# Patient Record
Sex: Male | Born: 1945 | Race: Black or African American | Hispanic: No | Marital: Married | Smoking: Former smoker
Health system: Southern US, Community
[De-identification: ages and names within clinical notes are randomized; demographics above are authoritative.]

## PROBLEM LIST (undated history)

## (undated) DIAGNOSIS — E039 Hypothyroidism, unspecified: Secondary | ICD-10-CM

## (undated) DIAGNOSIS — C61 Malignant neoplasm of prostate: Secondary | ICD-10-CM

## (undated) HISTORY — PX: PROSTATE BIOPSY: SHX241

---

## 2001-09-09 ENCOUNTER — Encounter: Payer: Self-pay | Admitting: Emergency Medicine

## 2001-09-09 ENCOUNTER — Emergency Department (HOSPITAL_COMMUNITY): Admission: EM | Admit: 2001-09-09 | Discharge: 2001-09-09 | Payer: Self-pay | Admitting: Emergency Medicine

## 2003-01-28 ENCOUNTER — Encounter: Payer: Self-pay | Admitting: *Deleted

## 2003-01-28 ENCOUNTER — Ambulatory Visit (HOSPITAL_COMMUNITY): Admission: RE | Admit: 2003-01-28 | Discharge: 2003-01-28 | Payer: Self-pay | Admitting: *Deleted

## 2018-10-10 NOTE — Progress Notes (Signed)
GU Location of Tumor / Histology: prostatic adenocarcinoma  If Prostate Cancer, Gleason Score is (4 + 3) and PSA is (6.11) in March 2020. Prostate volume: 43 grams.   Jhordan Mckibben had a PSA drawn in January 2020 that was elevated at 4.4. His PSA was repeated in the same month proving to be 4.5.   Biopsies of prostate (if applicable) revealed:    Past/Anticipated interventions by urology, if any: prostate biopsy, referral to Dr. Tammi Klippel for consideration of IMRT vs brachytherapy  Past/Anticipated interventions by medical oncology, if any: no  Weight changes, if any: Denies. Reports he switched to a plant based diet and lost 18 pounds in four months.  Bowel/Bladder complaints, if any: IPSS 2.  SHIM 25. Reports rare scant post void dribble. Denies dysuria or hematuria. Denies any bowel complaints.   Nausea/Vomiting, if any: no  Pain issues, if any:  no  SAFETY ISSUES:  Prior radiation? Yes for over active thyroid at age 22 at [facility]  Pacemaker/ICD? no  Possible current pregnancy? no, male patient  Is the patient on methotrexate? no  Current Complaints / other details:  73 year old male. Married.

## 2018-10-12 ENCOUNTER — Ambulatory Visit
Admission: RE | Admit: 2018-10-12 | Discharge: 2018-10-12 | Disposition: A | Discharge status: Home / Self Care | Payer: Medicare Other | Source: Ambulatory Visit | Attending: Radiation Oncology | Admitting: Radiation Oncology

## 2018-10-12 ENCOUNTER — Encounter: Payer: Self-pay | Admitting: Radiation Oncology

## 2018-10-12 ENCOUNTER — Other Ambulatory Visit: Payer: Self-pay

## 2018-10-12 VITALS — Ht 76.0 in | Wt 238.0 lb

## 2018-10-12 DIAGNOSIS — C61 Malignant neoplasm of prostate: Secondary | ICD-10-CM

## 2018-10-12 HISTORY — DX: Malignant neoplasm of prostate: C61

## 2018-10-12 NOTE — Progress Notes (Signed)
See progress note under physician encounter. 

## 2018-10-12 NOTE — Progress Notes (Addendum)
Radiation Oncology         [phone] ________________________________  Initial WebEx Consultation  Name: Nicholas Levine MRN: [id number]  Date: 10/12/2018  DOB: [birth year]  CC:No primary care provider on file.  McKenzie, Candee Furbish, MD   REFERRING PHYSICIAN: Cleon Gustin, MD  DIAGNOSIS: 73 y.o. gentleman with Stage T1c adenocarcinoma of the prostate with Gleason score of 4+3 and PSA of 6.11.    ICD-10-CM   1. Malignant neoplasm of prostate ([place]) C61     HISTORY OF PRESENT ILLNESS: Nicholas Levine is a 73 y.o. male with a diagnosis of prostate cancer. He was noted to have an elevated PSA of 4.5 by his primary care physician, Dr. Azalia Bilis.  Accordingly, he was referred for evaluation in urology by Dr. Alyson Ingles on 06/19/2018,  digital rectal examination was performed at that time revealing no nodules.  Repeat PSA on 07/30/2018 was 6.11.  The patient proceeded to transrectal ultrasound with 12 biopsies of the prostate on 08/28/2018.  The prostate volume measured 43 cc.  Out of 12 core biopsies, 6 were positive, all on the left.  The maximum Gleason score was 4+3, and this was seen in left mid lateral with perineural invasion. Gleason 3+4 was seen in the other 5 left cores with perineural invasion noted in left base lateral.  The patient reviewed the biopsy results with his urologist and he has kindly been referred today for discussion of potential radiation treatment options.   PREVIOUS RADIATION THERAPY: Yes  1965: Thyroid (at [place], age 52)  PAST MEDICAL HISTORY:  Past Medical History:  Diagnosis Date  . Prostate cancer ([address])       PAST SURGICAL HISTORY: Past Surgical History:  Procedure Laterality Date  . PROSTATE BIOPSY      FAMILY HISTORY:  Family History  Problem Relation Age of Onset  . Leukemia Maternal Aunt   . Leukemia Paternal Aunt     SOCIAL HISTORY:  Social History   Socioeconomic History  . Marital status: Married    Spouse name: Not on file  .  Number of children: 5  . Years of education: Not on file  . Highest education level: Not on file  Occupational History    Comment: retired  Scientific laboratory technician  . Financial resource strain: Not on file  . Food insecurity:    Worry: Not on file    Inability: Not on file  . Transportation needs:    Medical: Not on file    Non-medical: Not on file  Tobacco Use  . Smoking status: Former Smoker    Packs/day: 0.25    Years: 13.00    Pack years: 3.25    Types: Cigarettes    Last attempt to quit: 05/31/1971    Years since quitting: 47.4  . Smokeless tobacco: Never Used  . Tobacco comment: from age 73 to age 37  Substance and Sexual Activity  . Alcohol use: Not Currently    Frequency: Never    Comment: hasn't drank alcohol since age 86  . Drug use: Never  . Sexual activity: Yes  Lifestyle  . Physical activity:    Days per week: Not on file    Minutes per session: Not on file  . Stress: Not on file  Relationships  . Social connections:    Talks on phone: Not on file    Gets together: Not on file    Attends religious service: Not on file    Active member of club or organization: Not on  file    Attends meetings of clubs or organizations: Not on file    Relationship status: Not on file  . Intimate partner violence:    Fear of current or ex partner: Not on file    Emotionally abused: Not on file    Physically abused: Not on file    Forced sexual activity: Not on file  Other Topics Concern  . Not on file  Social History Narrative  . Not on file    ALLERGIES: Sulfa antibiotics  MEDICATIONS:  Current Outpatient Medications  Medication Sig Dispense Refill  . levothyroxine (SYNTHROID) 50 MCG tablet Take 150 mcg by mouth daily.    . Multiple Vitamins-Minerals (MULTIVITAMIN ADULT PO) Take by mouth.    . NON FORMULARY OTC prostate supplement     No current facility-administered medications for this encounter.     REVIEW OF SYSTEMS:  On review of systems, the patient reports that he  is doing well overall. He denies any chest pain, shortness of breath, cough, fevers, chills, night sweats, unintended weight changes. He denies any bowel disturbances, and denies abdominal pain, nausea or vomiting. He denies any new musculoskeletal or joint aches or pains. His IPSS was 2, indicating mild urinary symptoms. Reports rare scant post-void dribble. His SHIM was 25, indicating he does not erectile dysfunction. A complete review of systems is obtained and is otherwise negative.    PHYSICAL EXAM:  Wt Readings from Last 3 Encounters:  10/12/18 238 lb (108 kg)   Temp Readings from Last 3 Encounters:  No data found for Temp   BP Readings from Last 3 Encounters:  No data found for BP   Pulse Readings from Last 3 Encounters:  No data found for Pulse   Pain Assessment Pain Score: 0-No pain/10  In general this is a well appearing African American gentleman in no acute distress. He's alert and oriented x4 and appropriate throughout the examination. Cardiopulmonary assessment is negative for acute distress and he exhibits normal effort.    KPS = 100  100 - Normal; no complaints; no evidence of disease. 90   - Able to carry on normal activity; minor signs or symptoms of disease. 80   - Normal activity with effort; some signs or symptoms of disease. 55   - Cares for self; unable to carry on normal activity or to do active work. 60   - Requires occasional assistance, but is able to care for most of his personal needs. 50   - Requires considerable assistance and frequent medical care. 52   - Disabled; requires special care and assistance. 81   - Severely disabled; hospital admission is indicated although death not imminent. 53   - Very sick; hospital admission necessary; active supportive treatment necessary. 10   - Moribund; fatal processes progressing rapidly. 0     - Dead  Karnofsky DA, Abelmann WH, Craver LS and Burchenal JH [phone]) The use of the nitrogen mustards in the palliative  treatment of carcinoma: with particular reference to bronchogenic carcinoma Cancer 1 634-56  LABORATORY DATA:  No results found for: WBC, HGB, HCT, MCV, PLT No results found for: NA, K, CL, CO2 No results found for: ALT, AST, GGT, ALKPHOS, BILITOT   RADIOGRAPHY: No results found.    IMPRESSION/PLAN: 1. 73 y.o. gentleman with Stage T1c adenocarcinoma of the prostate with Gleason Score of 4+3, and PSA of 6.11. We discussed the patient's workup and outlines the nature of prostate cancer in this setting. The patient's Gleason's score  puts him into the unfavorable intermediate risk (UIR) group. Accordingly, he is eligible for a variety of potential treatment options including brachytherapy, 5.5 weeks of external radiation or prostatectomy. We discussed the available radiation techniques, and focused on the details and logistics and delivery. We discussed and outlined the risks, benefits, short and long-term effects associated with radiotherapy and compared and contrasted these with prostatectomy. We discussed the role of SpaceOAR in reducing the rectal toxicity associated with radiotherapy.   At the end of the conversation the patient is interested in moving forward with brachytherapy and use of SpaceOAR to reduce rectal toxicity from radiotherapy.  We will share our discussion with Dr. Alyson Ingles and move forward with scheduling his CT [facility] planning appointment in the near future.  The patient will be contacted by Romie Jumper in our office, who will be working closely with him to coordinate OR scheduling and pre and post procedure appointments, in the near future.  We will contact the pharmaceutical rep to ensure that [place] is available at the time of procedure.  He will have a prostate MRI following his post-seed CT SIM to confirm appropriate distribution of the [place].  Notably, during the time between now and seed implant, the patient is interested in pursuing alternative holistic approaches. He  likes the idea of having a seed implant in mid-August, with a PSA re-check at the beginning of August to see if his alternative strategy is helping.  Given current concerns for patient exposure during the COVID-19 pandemic, this encounter was conducted via video-enabled WebEx. The patient has given verbal consent for this type of encounter. The time spent during this encounter was 30 minutes. The attendants for this meeting include Tyler Pita MD, Wilburn Mylar- scribe, patient Nicholas Levine and his wife. During the encounter, Tyler Pita MD and scribe, Wilburn Mylar were located at [facility] Radiation Oncology Department.  Patient Nicholas Levine and his wife were located at home.   ------------------------------------------------   Tyler Pita, MD [place] Director and Director of Stereotactic Radiosurgery Direct Dial: [phone]  Fax: [phone] [email or web address]  Skype  [email or web address]   This document serves as a record of services personally performed by Tyler Pita, MD. It was created on his behalf by Wilburn Mylar, a trained medical scribe. The creation of this record is based on the scribe's personal observations and the provider's statements to them. This document has been checked and approved by the attending provider.

## 2018-10-14 DIAGNOSIS — C61 Malignant neoplasm of prostate: Secondary | ICD-10-CM | POA: Insufficient documentation

## 2018-10-14 NOTE — Addendum Note (Signed)
Encounter addended by: Tyler Pita, MD on: 10/14/2018 5:34 PM  Actions taken: Clinical Note Signed

## 2018-10-16 ENCOUNTER — Telehealth: Payer: Self-pay | Admitting: *Deleted

## 2018-10-16 NOTE — Telephone Encounter (Signed)
Called patient to ask question, lvm with wife - Nicholas Levine for a return call

## 2018-10-17 ENCOUNTER — Telehealth: Payer: Self-pay | Admitting: *Deleted

## 2018-10-17 NOTE — Telephone Encounter (Signed)
Called patient to ask question, spoke with patient. 

## 2018-11-23 ENCOUNTER — Other Ambulatory Visit: Payer: Self-pay | Admitting: Urology

## 2018-11-23 ENCOUNTER — Telehealth: Payer: Self-pay | Admitting: *Deleted

## 2018-11-23 DIAGNOSIS — C61 Malignant neoplasm of prostate: Secondary | ICD-10-CM

## 2018-11-23 NOTE — Telephone Encounter (Signed)
XXXX 

## 2018-11-27 ENCOUNTER — Telehealth: Payer: Self-pay | Admitting: *Deleted

## 2018-11-27 ENCOUNTER — Other Ambulatory Visit: Payer: Self-pay | Admitting: Urology

## 2018-11-27 NOTE — Telephone Encounter (Signed)
CALLED PATIENT TO INFORM OF PRE-SEED APPTS. FOR 01-10-19 AND HIS IMPLANT ON 02-08-19, SPOKE WITH PATIENT AND HE IS AWARE OF THESE APPTS.

## 2019-01-09 ENCOUNTER — Telehealth: Payer: Self-pay | Admitting: *Deleted

## 2019-01-09 NOTE — Telephone Encounter (Signed)
CALLED PATIENT TO REMIND OF PRE-SEED APPTS. FOR 01-10-19, SPOKE WITH PATIENT AND HE IS AWARE OF THESE APPTS.

## 2019-01-10 ENCOUNTER — Ambulatory Visit (HOSPITAL_COMMUNITY)
Admission: RE | Admit: 2019-01-10 | Discharge: 2019-01-10 | Disposition: A | Discharge status: Home / Self Care | Payer: 59 | Source: Ambulatory Visit | Attending: Urology | Admitting: Urology

## 2019-01-10 ENCOUNTER — Encounter: Payer: Self-pay | Admitting: Urology

## 2019-01-10 ENCOUNTER — Ambulatory Visit
Admission: RE | Admit: 2019-01-10 | Discharge: 2019-01-10 | Disposition: A | Discharge status: Home / Self Care | Payer: Medicare Other | Source: Ambulatory Visit | Attending: Urology | Admitting: Urology

## 2019-01-10 ENCOUNTER — Encounter: Payer: Self-pay | Admitting: Medical Oncology

## 2019-01-10 ENCOUNTER — Ambulatory Visit
Admission: RE | Admit: 2019-01-10 | Discharge: 2019-01-10 | Disposition: A | Discharge status: Home / Self Care | Payer: 59 | Source: Ambulatory Visit | Attending: Radiation Oncology | Admitting: Radiation Oncology

## 2019-01-10 ENCOUNTER — Encounter (HOSPITAL_COMMUNITY)
Admission: RE | Admit: 2019-01-10 | Discharge: 2019-01-10 | Disposition: A | Discharge status: Home / Self Care | Payer: 59 | Source: Ambulatory Visit | Attending: Radiation Oncology | Admitting: Radiation Oncology

## 2019-01-10 ENCOUNTER — Other Ambulatory Visit: Payer: Self-pay

## 2019-01-10 DIAGNOSIS — C61 Malignant neoplasm of prostate: Secondary | ICD-10-CM | POA: Insufficient documentation

## 2019-01-10 DIAGNOSIS — Z01818 Encounter for other preprocedural examination: Secondary | ICD-10-CM | POA: Diagnosis not present

## 2019-01-10 NOTE — Progress Notes (Signed)
  Radiation Oncology         [phone] ________________________________  Name: Nicholas Levine MRN: [id number]  Date: 01/10/2019  DOB: April 16, 1946  SIMULATION AND TREATMENT PLANNING NOTE PUBIC ARCH STUDY  SX:[id number], No Pcp Per  Cleon Gustin, MD  DIAGNOSIS: 73 y.o. gentleman with Stage T1c adenocarcinoma of the prostate with Gleason score of 4+3 and PSA of 6.11     ICD-10-CM   1. Malignant neoplasm of prostate ([place])  C61     COMPLEX SIMULATION:  The patient presented today for evaluation for possible prostate seed implant. He was brought to the radiation planning suite and placed supine on the CT couch. A 3-dimensional image study set was obtained in upload to the planning computer. There, on each axial slice, I contoured the prostate gland. Then, using three-dimensional radiation planning tools I reconstructed the prostate in view of the structures from the transperineal needle pathway to assess for possible pubic arch interference. In doing so, I did not appreciate any pubic arch interference. Also, the patient's prostate volume was estimated based on the drawn structure. The volume was 45 cc.  The previous TRUS volume was 43 cc.  Given the pubic arch appearance and prostate volume, patient remains a good candidate to proceed with prostate seed implant. Today, he freely provided informed written consent to proceed.    PLAN: The patient will undergo prostate seed implant.   ________________________________  Sheral Apley. Tammi Klippel, M.D.

## 2019-01-10 NOTE — Progress Notes (Signed)
[name] consulted with Dr. Tammi Klippel back in May and has chosen brachytherapy. I was unable to meet him in May due to visit being held by [employer].  He is here today for CT simulation and is scheduled for seed implant 02/08/19. Encouraged him to call with questions or concerns.

## 2019-01-11 NOTE — Progress Notes (Signed)
At the time of CT SIM/pre-seed planning visit, the patient expressed a desire to have a diagnostic MRI prostate prior to his upcoming brachytherapy procedure to confirm that brachytherapy remains an appropriate treatment option for him. We had a lengthy discussion regarding this request and I advised that this would need further discussion with his Urologist, Dr. Alyson Ingles who would ultimately make the decision and order the study if deemed appropriate. The patient states his understanding and is in agreement that I pass this request along to Dr. Alyson Ingles and he will follow up with him later this week for scheduling. Inbox communication has been sent to Dr. Alyson Ingles for review.  [name], MMS, PA-C [place] [facility] [place]: [phone]  Fax: [phone]

## 2019-01-30 ENCOUNTER — Telehealth: Payer: Self-pay | Admitting: *Deleted

## 2019-01-30 NOTE — Telephone Encounter (Signed)
CALLED PATIENT TO GO REMIND OF IMPLANT FOR 02-08-19, SPOKE WITH PATIENT AND HE IS AWARE OF THIS PROCEDURE

## 2019-02-05 ENCOUNTER — Encounter (HOSPITAL_COMMUNITY)
Admission: RE | Admit: 2019-02-05 | Discharge: 2019-02-05 | Disposition: A | Discharge status: Home / Self Care | Payer: 59 | Source: Ambulatory Visit | Attending: Urology | Admitting: Urology

## 2019-02-05 ENCOUNTER — Other Ambulatory Visit (HOSPITAL_COMMUNITY)
Admission: RE | Admit: 2019-02-05 | Discharge: 2019-02-05 | Disposition: A | Discharge status: Home / Self Care | Payer: 59 | Source: Ambulatory Visit | Attending: Urology | Admitting: Urology

## 2019-02-05 ENCOUNTER — Other Ambulatory Visit: Payer: Self-pay

## 2019-02-05 DIAGNOSIS — Z882 Allergy status to sulfonamides status: Secondary | ICD-10-CM | POA: Diagnosis not present

## 2019-02-05 DIAGNOSIS — C61 Malignant neoplasm of prostate: Secondary | ICD-10-CM | POA: Diagnosis present

## 2019-02-05 DIAGNOSIS — Z87891 Personal history of nicotine dependence: Secondary | ICD-10-CM | POA: Diagnosis not present

## 2019-02-05 DIAGNOSIS — Z01812 Encounter for preprocedural laboratory examination: Secondary | ICD-10-CM | POA: Insufficient documentation

## 2019-02-05 DIAGNOSIS — Z20828 Contact with and (suspected) exposure to other viral communicable diseases: Secondary | ICD-10-CM | POA: Insufficient documentation

## 2019-02-05 LAB — CBC
HCT: 43.5 % (ref 39.0–52.0)
Hemoglobin: 15 g/dL (ref 13.0–17.0)
MCH: 33.1 pg (ref 26.0–34.0)
MCHC: 34.5 g/dL (ref 30.0–36.0)
MCV: 96 fL (ref 80.0–100.0)
Platelets: 170 10*3/uL (ref 150–400)
RBC: 4.53 MIL/uL (ref 4.22–5.81)
RDW: 11.7 % (ref 11.5–15.5)
WBC: 2.8 10*3/uL — ABNORMAL LOW (ref 4.0–10.5)
nRBC: 0 % (ref 0.0–0.2)

## 2019-02-05 LAB — COMPREHENSIVE METABOLIC PANEL
ALT: 28 U/L (ref 0–44)
AST: 30 U/L (ref 15–41)
Albumin: 3.8 g/dL (ref 3.5–5.0)
Alkaline Phosphatase: 84 U/L (ref 38–126)
Anion gap: 8 (ref 5–15)
BUN: 12 mg/dL (ref 8–23)
CO2: 25 mmol/L (ref 22–32)
Calcium: 9 mg/dL (ref 8.9–10.3)
Chloride: 109 mmol/L (ref 98–111)
Creatinine, Ser: 0.93 mg/dL (ref 0.61–1.24)
GFR calc Af Amer: >60 mL/min (ref >60)
GFR calc non Af Amer: >60 mL/min (ref >60)
Glucose, Bld: 85 mg/dL (ref 70–99)
Potassium: 4 mmol/L (ref 3.5–5.1)
Sodium: 142 mmol/L (ref 135–145)
Total Bilirubin: 1.2 mg/dL (ref 0.3–1.2)
Total Protein: 6.6 g/dL (ref 6.5–8.1)

## 2019-02-05 LAB — PROTIME-INR
INR: 1.3 — ABNORMAL HIGH (ref 0.8–1.2)
Prothrombin Time: 15.6 seconds — ABNORMAL HIGH (ref 11.4–15.2)

## 2019-02-05 LAB — APTT: aPTT: 34 seconds (ref 24–36)

## 2019-02-06 ENCOUNTER — Encounter (HOSPITAL_BASED_OUTPATIENT_CLINIC_OR_DEPARTMENT_OTHER): Payer: Self-pay | Admitting: *Deleted

## 2019-02-06 ENCOUNTER — Other Ambulatory Visit: Payer: Self-pay

## 2019-02-06 NOTE — Progress Notes (Signed)
Spoke with Nicholas Levine, npo after midnight, arrive 930 am 02-08-2019 [facility], meds to take sip of water: levothyroxine, fleets enema am of surgery. Has surgery orders in epic. Records on chart/epic: ekg and chest xray done [id number], labs done 02-05-2019 cbc, cmet, pt, ptt. Driver wife [name] will stay for surgery in waiting room.   PCP - dr Zada Finders randal harris Cardiologist - saw cariology 04-Sep-2003 or 09/03/2004 due to brother died from mi, had cardiac cath and no heart problems found.  Chest x-ray - 01-10-19 epic EKG - 01-10-19 epic Stress Test - none ECHO - none Cardiac Cath - Sep 04, 2003 or 09-03-04 no problems found  Sleep Study - none CPAP - none  Fasting Blood Sugar - n/a Checks Blood Sugar _____ times a day  Blood Thinner Instructions:none Aspirin Instructions:none Last Dose:  Anesthesia review: chart to [place] [facility]etto for ekg review, cbc review and pt lab review Patient denies shortness of breath, fever, cough and chest pain at pre op phone interview   Patient verbalized understanding of instructions that were given to them at the  Pre op phone interview.. Patient was also instructed that they will need to review over the PAT instructions again at home before surgery.

## 2019-02-06 NOTE — Progress Notes (Signed)
Ekg, cbc and pt results ok per Janett Billow zanetto pa

## 2019-02-07 LAB — NOVEL CORONAVIRUS, NAA (HOSP ORDER, SEND-OUT TO REF LAB; TAT 18-24 HRS): SARS-CoV-2, NAA: NOT DETECTED

## 2019-02-08 ENCOUNTER — Encounter (HOSPITAL_BASED_OUTPATIENT_CLINIC_OR_DEPARTMENT_OTHER): Payer: Self-pay | Admitting: Emergency Medicine

## 2019-02-08 ENCOUNTER — Other Ambulatory Visit: Payer: Self-pay

## 2019-02-08 ENCOUNTER — Ambulatory Visit (HOSPITAL_BASED_OUTPATIENT_CLINIC_OR_DEPARTMENT_OTHER)
Admission: RE | Admit: 2019-02-08 | Discharge: 2019-02-08 | Disposition: A | Discharge status: Home / Self Care | Payer: 59 | Attending: Urology | Admitting: Urology

## 2019-02-08 ENCOUNTER — Encounter (HOSPITAL_BASED_OUTPATIENT_CLINIC_OR_DEPARTMENT_OTHER)
Admission: RE | Disposition: A | Discharge status: Home / Self Care | Payer: Self-pay | Source: Home / Self Care | Attending: Urology

## 2019-02-08 ENCOUNTER — Ambulatory Visit (HOSPITAL_BASED_OUTPATIENT_CLINIC_OR_DEPARTMENT_OTHER): Payer: 59 | Admitting: Anesthesiology

## 2019-02-08 ENCOUNTER — Ambulatory Visit (HOSPITAL_BASED_OUTPATIENT_CLINIC_OR_DEPARTMENT_OTHER): Payer: 59 | Admitting: Physician Assistant

## 2019-02-08 ENCOUNTER — Ambulatory Visit (HOSPITAL_COMMUNITY): Payer: 59

## 2019-02-08 DIAGNOSIS — Z87891 Personal history of nicotine dependence: Secondary | ICD-10-CM | POA: Insufficient documentation

## 2019-02-08 DIAGNOSIS — Z01818 Encounter for other preprocedural examination: Secondary | ICD-10-CM

## 2019-02-08 DIAGNOSIS — C61 Malignant neoplasm of prostate: Secondary | ICD-10-CM | POA: Insufficient documentation

## 2019-02-08 DIAGNOSIS — Z20828 Contact with and (suspected) exposure to other viral communicable diseases: Secondary | ICD-10-CM | POA: Insufficient documentation

## 2019-02-08 DIAGNOSIS — Z882 Allergy status to sulfonamides status: Secondary | ICD-10-CM | POA: Insufficient documentation

## 2019-02-08 HISTORY — PX: SPACE OAR INSTILLATION: SHX6769

## 2019-02-08 HISTORY — DX: Hypothyroidism, unspecified: E03.9

## 2019-02-08 HISTORY — PX: RADIOACTIVE SEED IMPLANT: SHX5150

## 2019-02-08 SURGERY — INSERTION, RADIATION SOURCE, PROSTATE
Anesthesia: General | Site: Prostate

## 2019-02-08 MED ORDER — ONDANSETRON HCL 4 MG/2ML IJ SOLN
4.0000 mg | Freq: Once | INTRAMUSCULAR | Status: DC | PRN
  Filled 2019-02-08: qty 2

## 2019-02-08 MED ORDER — MIDAZOLAM HCL 5 MG/5ML IJ SOLN
INTRAMUSCULAR | Status: DC | PRN
  Administered 2019-02-08: 1 mg via INTRAVENOUS

## 2019-02-08 MED ORDER — FENTANYL CITRATE (PF) 100 MCG/2ML IJ SOLN
INTRAMUSCULAR | Status: DC | PRN
  Administered 2019-02-08 (×2): 50 ug via INTRAVENOUS

## 2019-02-08 MED ORDER — OXYCODONE HCL 5 MG PO TABS
5.0000 mg | ORAL_TABLET | Freq: Once | ORAL | Status: DC | PRN
  Filled 2019-02-08: qty 1

## 2019-02-08 MED ORDER — SODIUM CHLORIDE 0.9 % IV SOLN
INTRAVENOUS | Status: AC | PRN
  Administered 2019-02-08: 50 mL

## 2019-02-08 MED ORDER — CEFAZOLIN SODIUM-DEXTROSE 2-4 GM/100ML-% IV SOLN
2.0000 g | Freq: Once | INTRAVENOUS | Status: AC
  Administered 2019-02-08: 2 g via INTRAVENOUS
  Filled 2019-02-08: qty 100

## 2019-02-08 MED ORDER — ONDANSETRON HCL 4 MG/2ML IJ SOLN
INTRAMUSCULAR | Status: DC | PRN
  Administered 2019-02-08: 4 mg via INTRAVENOUS

## 2019-02-08 MED ORDER — SODIUM CHLORIDE (PF) 0.9 % IJ SOLN
INTRAMUSCULAR | Status: DC | PRN
  Administered 2019-02-08: 3 mL

## 2019-02-08 MED ORDER — PROPOFOL 10 MG/ML IV BOLUS
INTRAVENOUS | Status: DC | PRN
  Administered 2019-02-08: 50 mg via INTRAVENOUS
  Administered 2019-02-08: 150 mg via INTRAVENOUS

## 2019-02-08 MED ORDER — PROPOFOL 10 MG/ML IV BOLUS
INTRAVENOUS | Status: AC
  Filled 2019-02-08: qty 20

## 2019-02-08 MED ORDER — SODIUM CHLORIDE FLUSH 0.9 % IV SOLN
INTRAVENOUS | Status: DC | PRN
  Administered 2019-02-08: 10 mL

## 2019-02-08 MED ORDER — IOHEXOL 300 MG/ML  SOLN
INTRAMUSCULAR | Status: DC | PRN
  Administered 2019-02-08: 7 mL

## 2019-02-08 MED ORDER — FLEET ENEMA 7-19 GM/118ML RE ENEM
1.0000 | ENEMA | Freq: Once | RECTAL | Status: DC
  Filled 2019-02-08: qty 1

## 2019-02-08 MED ORDER — TRAMADOL HCL 50 MG PO TABS: 50.0000 mg | ORAL_TABLET | Freq: Four times a day (QID) | ORAL | 0 refills | Status: AC | PRN

## 2019-02-08 MED ORDER — FENTANYL CITRATE (PF) 100 MCG/2ML IJ SOLN
25.0000 ug | INTRAMUSCULAR | Status: DC | PRN
  Administered 2019-02-08: 25 ug via INTRAVENOUS
  Filled 2019-02-08: qty 1

## 2019-02-08 MED ORDER — EPHEDRINE SULFATE 50 MG/ML IJ SOLN
INTRAMUSCULAR | Status: DC | PRN
  Administered 2019-02-08: 10 mg via INTRAVENOUS

## 2019-02-08 MED ORDER — MIDAZOLAM HCL 2 MG/2ML IJ SOLN
INTRAMUSCULAR | Status: AC
  Filled 2019-02-08: qty 2

## 2019-02-08 MED ORDER — LIDOCAINE HCL (CARDIAC) PF 100 MG/5ML IV SOSY
PREFILLED_SYRINGE | INTRAVENOUS | Status: DC | PRN
  Administered 2019-02-08: 60 mg via INTRAVENOUS

## 2019-02-08 MED ORDER — GLYCOPYRROLATE 0.2 MG/ML IJ SOLN
INTRAMUSCULAR | Status: DC | PRN
  Administered 2019-02-08: 0.2 mg via INTRAVENOUS

## 2019-02-08 MED ORDER — LACTATED RINGERS IV SOLN
INTRAVENOUS | Status: DC
  Administered 2019-02-08: 10:00:00 via INTRAVENOUS
  Filled 2019-02-08: qty 1000

## 2019-02-08 MED ORDER — CEFAZOLIN SODIUM-DEXTROSE 2-4 GM/100ML-% IV SOLN
INTRAVENOUS | Status: AC
  Filled 2019-02-08: qty 100

## 2019-02-08 MED ORDER — DEXAMETHASONE SODIUM PHOSPHATE 10 MG/ML IJ SOLN
INTRAMUSCULAR | Status: DC | PRN
  Administered 2019-02-08: 5 mg via INTRAVENOUS

## 2019-02-08 MED ORDER — FENTANYL CITRATE (PF) 100 MCG/2ML IJ SOLN
INTRAMUSCULAR | Status: AC
  Filled 2019-02-08: qty 2

## 2019-02-08 MED ORDER — OXYCODONE HCL 5 MG/5ML PO SOLN
5.0000 mg | Freq: Once | ORAL | Status: DC | PRN
  Filled 2019-02-08: qty 5

## 2019-02-08 SURGICAL SUPPLY — 41 items
BAG URINE DRAINAGE (UROLOGICAL SUPPLIES) ×4 IMPLANT
BLADE CLIPPER SENSICLIP SURGIC (BLADE) ×4 IMPLANT
CATH FOLEY 2WAY SLVR  5CC 16FR (CATHETERS) ×4
CATH FOLEY 2WAY SLVR 5CC 16FR (CATHETERS) ×4 IMPLANT
CATH ROBINSON RED A/P 20FR (CATHETERS) ×4 IMPLANT
CLOTH BEACON ORANGE TIMEOUT ST (SAFETY) ×4 IMPLANT
CONT SPECI 4OZ STER CLIK (MISCELLANEOUS) ×8 IMPLANT
COVER BACK TABLE 60X90IN (DRAPES) ×4 IMPLANT
COVER MAYO STAND STRL (DRAPES) ×4 IMPLANT
COVER WAND RF STERILE (DRAPES) IMPLANT
DRSG TEGADERM 4X4.75 (GAUZE/BANDAGES/DRESSINGS) ×8 IMPLANT
DRSG TEGADERM 8X12 (GAUZE/BANDAGES/DRESSINGS) ×4 IMPLANT
GAUZE SPONGE 4X4 12PLY STRL (GAUZE/BANDAGES/DRESSINGS) ×4 IMPLANT
GAUZE SPONGE 4X4 12PLY STRL LF (GAUZE/BANDAGES/DRESSINGS) ×4 IMPLANT
GLOVE BIO SURGEON STRL SZ7 (GLOVE) ×4 IMPLANT
GLOVE BIO SURGEON STRL SZ8 (GLOVE) ×4 IMPLANT
GLOVE BIOGEL PI IND STRL 7.0 (GLOVE) ×4 IMPLANT
GLOVE BIOGEL PI IND STRL 8 (GLOVE) IMPLANT
GLOVE BIOGEL PI INDICATOR 7.0 (GLOVE) ×4
GLOVE BIOGEL PI INDICATOR 8 (GLOVE)
GLOVE SURG ORTHO 8.5 STRL (GLOVE) ×16 IMPLANT
GOWN STRL REUS W/ TWL LRG LVL3 (GOWN DISPOSABLE) ×2 IMPLANT
GOWN STRL REUS W/TWL LRG LVL3 (GOWN DISPOSABLE) ×2
GOWN STRL REUS W/TWL XL LVL3 (GOWN DISPOSABLE) ×8 IMPLANT
HOLDER FOLEY CATH W/STRAP (MISCELLANEOUS) ×4 IMPLANT
I-Seed AgX100 ×316 IMPLANT
IMPL SPACEOAR SYSTEM 10ML (Spacer) ×2 IMPLANT
IMPLANT SPACEOAR SYSTEM 10ML (Spacer) ×4 IMPLANT
IV NS 1000ML (IV SOLUTION) ×2
IV NS 1000ML BAXH (IV SOLUTION) ×2 IMPLANT
KIT TURNOVER CYSTO (KITS) ×4 IMPLANT
MANIFOLD NEPTUNE II (INSTRUMENTS) IMPLANT
MARKER SKIN DUAL TIP RULER LAB (MISCELLANEOUS) ×4 IMPLANT
NS IRRIG 500ML POUR BTL (IV SOLUTION) ×4 IMPLANT
PACK CYSTO (CUSTOM PROCEDURE TRAY) ×4 IMPLANT
SURGILUBE 2OZ TUBE FLIPTOP (MISCELLANEOUS) ×4 IMPLANT
SYR 10ML LL (SYRINGE) ×8 IMPLANT
TOWEL OR 17X26 10 PK STRL BLUE (TOWEL DISPOSABLE) ×4 IMPLANT
UNDERPAD 30X30 (UNDERPADS AND DIAPERS) ×8 IMPLANT
WATER STERILE IRR 3000ML UROMA (IV SOLUTION) IMPLANT
WATER STERILE IRR 500ML POUR (IV SOLUTION) ×4 IMPLANT

## 2019-02-08 NOTE — Anesthesia Procedure Notes (Signed)
Procedure Name: LMA Insertion Date/Time: 02/08/2019 11:51 AM Performed by: Jonna Munro, CRNA Pre-anesthesia Checklist: Patient identified, Emergency Drugs available, Suction available, Patient being monitored and Timeout performed Patient Re-evaluated:Patient Re-evaluated prior to induction Oxygen Delivery Method: Circle system utilized Preoxygenation: Pre-oxygenation with 100% oxygen Induction Type: IV induction LMA: LMA inserted LMA Size: 5.0 Number of attempts: 1 Dental Injury: Teeth and Oropharynx as per pre-operative assessment

## 2019-02-08 NOTE — Anesthesia Postprocedure Evaluation (Signed)
Anesthesia Post Note  Patient: Demilade Ducasse  Procedure(s) Performed: RADIOACTIVE SEED IMPLANT/BRACHYTHERAPY IMPLANT (N/A Prostate) SPACE OAR INSTILLATION (N/A Perineum)     Patient location during evaluation: PACU Anesthesia Type: General Level of consciousness: awake and alert Pain management: pain level controlled Vital Signs Assessment: post-procedure vital signs reviewed and stable Respiratory status: spontaneous breathing, nonlabored ventilation and respiratory function stable Cardiovascular status: blood pressure returned to baseline and stable Postop Assessment: no apparent nausea or vomiting Anesthetic complications: no    Last Vitals:  Vitals:   02/08/19 1315 02/08/19 1330  BP: (!) 143/74 (!) 145/72  Pulse: (!) 58 (!) 57  Resp: 13 19  Temp:    SpO2: 100% 99%    Last Pain:  Vitals:   02/08/19 1330  TempSrc:   PainSc: 6                  Lidia Collum

## 2019-02-08 NOTE — Discharge Instructions (Signed)
Indwelling Urinary Catheter Care, Adult °An indwelling urinary catheter is a thin tube that is put into your bladder. The tube helps to drain pee (urine) out of your body. The tube goes in through your urethra. Your urethra is where pee comes out of your body. Your pee will come out through the catheter, then it will go into a bag (drainage bag). °Take good care of your catheter so it will work well. °How to wear your catheter and bag °Supplies needed °· Sticky tape (adhesive tape) or a leg strap. °· Alcohol wipe or soap and water (if you use tape). °· A clean towel (if you use tape). °· Large overnight bag. °· Smaller bag (leg bag). °Wearing your catheter °Attach your catheter to your leg with tape or a leg strap. °· Make sure the catheter is not pulled tight. °· If a leg strap gets wet, take it off and put on a dry strap. °· If you use tape to hold the bag on your leg: °1. Use an alcohol wipe or soap and water to wash your skin where the tape made it sticky before. °2. Use a clean towel to pat-dry that skin. °3. Use new tape to make the bag stay on your leg. °Wearing your bags °You should have been given a large overnight bag. °· You may wear the overnight bag in the day or night. °· Always have the overnight bag lower than your bladder.  Do not let the bag touch the floor. °· Before you go to sleep, put a clean plastic bag in a wastebasket. Then hang the overnight bag inside the wastebasket. °You should also have a smaller leg bag that fits under your clothes. °· Always wear the leg bag below your knee. °· Do not wear your leg bag at night. °How to care for your skin and catheter °Supplies needed °· A clean washcloth. °· Water and mild soap. °· A clean towel. °Caring for your skin and catheter ° °  ° °· Clean the skin around your catheter every day: °? Wash your hands with soap and water. °? Wet a clean washcloth in warm water and mild soap. °? Clean the skin around your urethra. °? If you are male: °? Gently  spread the folds of skin around your vagina (labia). °? With the washcloth in your other hand, wipe the inner side of your labia on each side. Wipe from front to back. °? If you are male: °? Pull back any skin that covers the end of your penis (foreskin). °? With the washcloth in your other hand, wipe your penis in small circles. Start wiping at the tip of your penis, then move away from the catheter. °? Move the foreskin back in place, if needed. °? With your free hand, hold the catheter close to where it goes into your body. °? Keep holding the catheter during cleaning so it does not get pulled out. °? With the washcloth in your other hand, clean the catheter. °? Only wipe downward on the catheter. °? Do not wipe upward toward your body. Doing this may push germs into your urethra and cause infection. °? Use a clean towel to pat-dry the catheter and the skin around it. Make sure to wipe off all soap. °? Wash your hands with soap and water. °· Shower every day. Do not take baths. °· Do not use cream, ointment, or lotion on the area where the catheter goes into your body, unless your doctor tells you   to. °· Do not use powders, sprays, or lotions on your genital area. °· Check your skin around the catheter every day for signs of infection. Check for: °? Redness, swelling, or pain. °? Fluid or blood. °? Warmth. °? Pus or a bad smell. °How to empty the bag °Supplies needed °· Rubbing alcohol. °· Gauze pad or cotton ball. °· Tape or a leg strap. °Emptying the bag °Pour the pee out of your bag when it is ?-½ full, or at least 2-3 times a day. Do this for your overnight bag and your leg bag. °1. Wash your hands with soap and water. °2. Separate (detach) the bag from your leg. °3. Hold the bag over the toilet or a clean pail. Keep the bag lower than your hips and bladder. This is so the pee (urine) does not go back into the tube. °4. Open the pour spout. It is at the bottom of the bag. °5. Empty the pee into the toilet or  pail. Do not let the pour spout touch any surface. °6. Put rubbing alcohol on a gauze pad or cotton ball. °7. Use the gauze pad or cotton ball to clean the pour spout. °8. Close the pour spout. °9. Attach the bag to your leg with tape or a leg strap. °10. Wash your hands with soap and water. °Follow instructions for cleaning the drainage bag: °· From the product maker. °· As told by your doctor. °How to change the bag °Supplies needed °· Alcohol wipes. °· A clean bag. °· Tape or a leg strap. °Changing the bag °Replace your bag when it starts to leak, smell bad, or look dirty. °1. Wash your hands with soap and water. °2. Separate the dirty bag from your leg. °3. Pinch the catheter with your fingers so that pee does not spill out. °4. Separate the catheter tube from the bag tube where these tubes connect (at the connection valve). Do not let the tubes touch any surface. °5. Clean the end of the catheter tube with an alcohol wipe. Use a different alcohol wipe to clean the end of the bag tube. °6. Connect the catheter tube to the tube of the clean bag. °7. Attach the clean bag to your leg with tape or a leg strap. Do not make the bag tight on your leg. °8. Wash your hands with soap and water. °General rules ° °· Never pull on your catheter. Never try to take it out. Doing that can hurt you. °· Always wash your hands before and after you touch your catheter or bag. Use a mild, fragrance-free soap. If you do not have soap and water, use hand sanitizer. °· Always make sure there are no twists or bends (kinks) in the catheter tube. °· Always make sure there are no leaks in the catheter or bag. °· Drink enough fluid to keep your pee pale yellow. °· Do not take baths, swim, or use a hot tub. °· If you are male, wipe from front to back after you poop (have a bowel movement). °Contact a doctor if: °· Your pee is cloudy. °· Your pee smells worse than usual. °· Your catheter gets clogged. °· Your catheter leaks. °· Your bladder  feels full. °Get help right away if: °· You have redness, swelling, or pain where the catheter goes into your body. °· You have fluid, blood, pus, or a bad smell coming from the area where the catheter goes into your body. °· Your skin feels warm where   the catheter goes into your body.  You have a fever.  You have pain in your: ? Belly (abdomen). ? Legs. ? Lower back. ? Bladder.  You see blood in the catheter.  Your pee is pink or red.  You feel sick to your stomach (nauseous).  You throw up (vomit).  You have chills.  Your pee is not draining into the bag.  Your catheter gets pulled out. Summary  An indwelling urinary catheter is a thin tube that is placed into the bladder to help drain pee (urine) out of the body.  The catheter is placed into the part of the body that drains pee from the bladder (urethra).  Taking good care of your catheter will keep it working properly and help prevent problems.  Always wash your hands before and after touching your catheter or bag.  Never pull on your catheter or try to take it out. This information is not intended to replace advice given to you by your health care provider. Make sure you discuss any questions you have with your health care provider. Document Released: 09/10/2012 Document Revised: 09/07/2018 Document Reviewed: 12/30/2016 Elsevier Patient Education  [place] Instructions  Activity: Get plenty of rest for the remainder of the day. A responsible adult should stay with you for 24 hours following the procedure.  For the next 24 hours, DO NOT: -Drive a car -Paediatric nurse -Drink alcoholic beverages -Take any medication unless instructed by your physician -Make any legal decisions or sign important papers.  Meals: Start with liquid foods such as gelatin or soup. Progress to regular foods as tolerated. Avoid greasy, spicy, heavy foods. If nausea and/or vomiting occur, drink  only clear liquids until the nausea and/or vomiting subsides. Call your physician if vomiting continues.  Special Instructions/Symptoms: Your throat may feel dry or sore from the anesthesia or the breathing tube placed in your throat during surgery. If this causes discomfort, gargle with warm salt water. The discomfort should disappear within 24 hours.  If you had a scopolamine patch placed behind your ear for the management of post- operative nausea and/or vomiting:  1. The medication in the patch is effective for 72 hours, after which it should be removed.  Wrap patch in a tissue and discard in the trash. Wash hands thoroughly with soap and water. 2. You may remove the patch earlier than 72 hours if you experience unpleasant side effects which may include dry mouth, dizziness or visual disturbances. 3. Avoid touching the patch. Wash your hands with soap and water after contact with the patch.

## 2019-02-08 NOTE — Transfer of Care (Signed)
Immediate Anesthesia Transfer of Care Note  Patient: Nicholas Levine  Procedure(s) Performed: RADIOACTIVE SEED IMPLANT/BRACHYTHERAPY IMPLANT (N/A Prostate) SPACE OAR INSTILLATION (N/A Perineum)  Patient Location: PACU  Anesthesia Type:General  Level of Consciousness: awake, alert , oriented and patient cooperative  Airway & Oxygen Therapy: Patient Spontanous Breathing and Patient connected to face mask oxygen  Post-op Assessment: Report given to RN, Post -op Vital signs reviewed and stable and Patient moving all extremities X 4  Post vital signs: Reviewed and stable  Last Vitals:  Vitals Value Taken Time  BP 141/71 02/08/19 1303  Temp 36.3 C 02/08/19 1303  Pulse 69 02/08/19 1303  Resp 14 02/08/19 1303  SpO2 100 % 02/08/19 1303    Last Pain:  Vitals:   02/08/19 1023  TempSrc: Oral      Patients Stated Pain Goal: 4 ([id number])  Complications: No apparent anesthesia complications

## 2019-02-08 NOTE — Op Note (Signed)
PRE-OPERATIVE DIAGNOSIS:  Adenocarcinoma of the prostate  POST-OPERATIVE DIAGNOSIS:  Same  PROCEDURE:  Procedure(s): 1. I-125 radioactive seed implantation 2. Cystoscopy 3. Placement of SpaceOAR  SURGEON:  Surgeon(s): Patrick Mckenzie, MD  Radiation oncologist:  Matthew Manning, MD  ANESTHESIA:  General  EBL:  Minimal  DRAINS: 16 French Foley catheter  INDICATION: Nicholas Levine is a 72 year old with a history of T1c prostate cancer. After discussing treatment options he has elected to proceed with brachytherapy and SpaceOAR  Description of procedure: After informed consent the patient was brought to the major OR, placed on the table and administered general anesthesia. He was then moved to the modified lithotomy position with his perineum perpendicular to the floor. His perineum and genitalia were then sterilely prepped. An official timeout was then performed. A 16 French Foley catheter was then placed in the bladder and filled with dilute contrast, a rectal tube was placed in the rectum and the transrectal ultrasound probe was placed in the rectum and affixed to the stand. He was then sterilely draped.  Real time ultrasonography was used along with the seed planning software [employer] Prostate vs. 4.2.21. This was used to develop the seed plan including the number of needles as well as number of seeds required for complete and adequate coverage. Real-time ultrasonography was then used along with the previously developed plan and the Nucletron device to implant a total of 79 seeds using 26 needles. This proceeded without difficulty or complication.  We then proceeded to mix the SpaceOAR using the kit supplied from the manufacturer. Once this was complete we placed a sinal needle into the perirectal fat between the rectum and the prostate. Once this was accomplished we injected 2cc of normal saline to hydrodissect the plain. We then instilled the the SpaceOAR through the spinal needle and  noted good distribution in the perirectal fat.      A Foley catheter was then removed as well as the transrectal ultrasound probe and rectal probe. Flexible cystoscopy was then performed using the 17 French flexible scope which revealed a normal urethra throughout its length down to the sphincter which appeared intact. The prostatic urethra revealed bilobar hypertrophy but no evidence of obstruction, seeds, spacers or lesions. The bladder was then entered and fully and systematically inspected. The ureteral orifices were noted to be of normal configuration and position. The mucosa revealed no evidence of tumors. There were also no stones identified within the bladder. I noted no seeds or spacers on the floor of the bladder and retroflexion of the scope revealed no seeds protruding from the base of the prostate.  The cystoscope was then removed and a new 16 French Foley catheter was then inserted and the balloon was filled with 10 cc of sterile water. This was connected to closed system drainage and the patient was awakened and taken to recovery room in stable and satisfactory condition. He tolerated procedure well and there were no intraoperative complications.   

## 2019-02-08 NOTE — Anesthesia Preprocedure Evaluation (Addendum)
Anesthesia Evaluation  Patient identified by MRN, date of birth, ID band Patient awake    Reviewed: Allergy & Precautions, NPO status , Patient's Chart, lab work & pertinent test results  History of Anesthesia Complications Negative for: history of anesthetic complications  Airway Mallampati: II  TM Distance: >3 FB Neck ROM: Full    Dental   Pulmonary neg pulmonary ROS, former smoker,    Pulmonary exam normal        Cardiovascular negative cardio ROS Normal cardiovascular exam     Neuro/Psych negative neurological ROS  negative psych ROS   GI/Hepatic negative GI ROS, Neg liver ROS,   Endo/Other  Hypothyroidism   Renal/GU negative Renal ROS  negative genitourinary   Musculoskeletal negative musculoskeletal ROS (+)   Abdominal   Peds  Hematology negative hematology ROS (+)   Anesthesia Other Findings Prostate cancer  Reproductive/Obstetrics                            Anesthesia Physical Anesthesia Plan  ASA: II  Anesthesia Plan: General   Post-op Pain Management:    Induction: Intravenous  PONV Risk Score and Plan: 3 and Ondansetron, Dexamethasone, Treatment may vary due to age or medical condition and Midazolam  Airway Management Planned: LMA  Additional Equipment: None  Intra-op Plan:   Post-operative Plan: Extubation in OR  Informed Consent: I have reviewed the patients History and Physical, chart, labs and discussed the procedure including the risks, benefits and alternatives for the proposed anesthesia with the patient or authorized representative who has indicated his/her understanding and acceptance.     Dental advisory given  Plan Discussed with:   Anesthesia Plan Comments:        Anesthesia Quick Evaluation

## 2019-02-08 NOTE — H&P (Signed)
Urology Admission H&P  Chief Complaint: prostate cancer  History of Present Illness: Mr Estrela is a 73yo with a hx of T1c prostate cancer here for brachytherapy with SpaceOAR. Mild baseline lUTS. No ED. No fevers/chills/sweast.   Past Medical History:  Diagnosis Date  . Hypothyroidism   . Prostate cancer [facility])    Past Surgical History:  Procedure Laterality Date  . PROSTATE BIOPSY    . radioactive iodine  1975   to slow thryoid  . right knee arthroscopy  1971   exploratory surgery for ligement repair    Home Medications:  Current Facility-Administered Medications  Medication Dose Route Frequency Provider Last Rate Last Dose  . ceFAZolin (ANCEF) IVPB 2g/100 mL premix  2 g Intravenous Once Cleon Gustin, MD      . lactated ringers infusion   Intravenous Continuous Lidia Collum, MD 50 mL/hr at 02/08/19 1029    . [START ON 02/09/2019] sodium phosphate (FLEET) 7-19 GM/118ML enema 1 enema  1 enema Rectal Once Caylei Sperry, Candee Furbish, MD       Allergies:  Allergies  Allergen Reactions  . Sulfa Antibiotics     Family History  Problem Relation Age of Onset  . Leukemia Maternal Aunt   . Leukemia Paternal Aunt    Social History:  reports that he has quit smoking. His smoking use included cigarettes. He has a 3.25 pack-year smoking history. He has never used smokeless tobacco. He reports previous alcohol use. He reports that he does not use drugs.  Review of Systems  All other systems reviewed and are negative.   Physical Exam:  Vital signs in last 24 hours: Temp:  [97.5 F (36.4 C)] 97.5 F (36.4 C) (09/11 1023) Pulse Rate:  [54] 54 (09/11 1023) Resp:  [16] 16 (09/11 1023) BP: (130)/(77) 130/77 (09/11 1023) SpO2:  [100 %] 100 % (09/11 1023) Weight:  [102.1 kg] 102.1 kg (09/11 1023) Physical Exam  Constitutional: He is oriented to person, place, and time. He appears well-developed and well-nourished.  HENT:  Head: Normocephalic and atraumatic.  Eyes: Pupils are  equal, round, and reactive to light. EOM are normal.  Neck: Normal range of motion. No thyromegaly present.  Cardiovascular: Normal rate and regular rhythm.  Respiratory: Effort normal. No respiratory distress.  GI: Soft. He exhibits no distension.  Musculoskeletal: Normal range of motion.        General: No edema.  Neurological: He is alert and oriented to person, place, and time.  Skin: Skin is warm and dry.  Psychiatric: He has a normal mood and affect. His behavior is normal. Judgment and thought content normal.    Laboratory Data:  No results found for this or any previous visit (from the past 24 hour(s)). Recent Results (from the past 240 hour(s))  Novel Coronavirus, NAA (Hosp order, Send-out to Ref Lab; TAT 18-24 hrs     Status: None   Collection Time: 02/05/19 12:22 PM   Specimen: Nasopharyngeal Swab; Respiratory  Result Value Ref Range Status   SARS-CoV-2, NAA NOT DETECTED NOT DETECTED Final    Comment: (NOTE) This nucleic acid amplification test was developed and its performance characteristics determined by [employer]. Nucleic acid amplification tests include PCR and TMA. This test has not been FDA cleared or approved. This test has been authorized by FDA under an Emergency Use Authorization (EUA). This test is only authorized for the duration of time the declaration that circumstances exist justifying the authorization of the emergency use of in vitro diagnostic tests  for detection of SARS-CoV-2 virus and/or diagnosis of COVID-19 infection under section 564(b)(1) of the Act, 21 U.S.C. [id number]) (1), unless the authorization is terminated or revoked sooner. When diagnostic testing is negative, the possibility of a false negative result should be considered in the context of a patient's recent exposures and the presence of clinical signs and symptoms consistent with COVID-19. An individual without symptoms of COVID- 19 and who is not shedding SARS-CoV-2  vi rus would expect to have a negative (not detected) result in this assay. Performed At: [facility] [address] [place], [place] [id number] Rush Farmer MD [id number]    [place]  Final    Comment: Performed at [facility], [place] [address], [place],  [address]   Creatinine: Recent Labs    02/05/19 0940  CREATININE 0.93   Baseline Creatinine: 0.9  Impression/Assessment:  72yo with T1c prostate cancer  Plan:  The risks/benefits/alterantives to brachytherapy with SpaceOAR was explained to the patient and he understands and wishes to proceed with surgery  Nicolette Bang 02/08/2019, 11:34 AM

## 2019-02-10 NOTE — Progress Notes (Signed)
  Radiation Oncology         [phone]) [phone] ________________________________  Name: Jestin Koob MRN: [id number]  Date: 02/10/2019  DOB: December 08, 1945       Prostate Seed Implant  [id number], Gwyndolyn Saxon, MD  No ref. provider found  DIAGNOSIS: 73 y.o. gentleman with Stage T1c adenocarcinoma of the prostate with Gleason score of 4+3 and PSA of 6.11    ICD-10-CM   1. Preop testing  Z01.818 DG Chest 2 View    DG Chest 2 View    PROCEDURE: Insertion of radioactive I-125 seeds into the prostate gland.  RADIATION DOSE: 145 Gy, definitive therapy.  TECHNIQUE: Verlyn Saurman was brought to the operating room with the urologist. He was placed in the dorsolithotomy position. He was catheterized and a rectal tube was inserted. The perineum was shaved, prepped and draped. The ultrasound probe was then introduced into the rectum to see the prostate gland.  TREATMENT DEVICE: A needle grid was attached to the ultrasound probe stand and anchor needles were placed.  3D PLANNING: The prostate was imaged in 3D using a sagittal sweep of the prostate probe. These images were transferred to the planning computer. There, the prostate, urethra and rectum were defined on each axial reconstructed image. Then, the software created an optimized 3D plan and a few seed positions were adjusted. The quality of the plan was reviewed using [facility] information for the target and the following two organs at risk:  Urethra and Rectum.  Then the accepted plan was printed and handed off to the radiation therapist.  Under my supervision, the custom loading of the seeds and spacers was carried out and loaded into sealed vicryl sleeves.  These pre-loaded needles were then placed into the needle holder.Marland Kitchen  PROSTATE VOLUME STUDY:  Using transrectal ultrasound the volume of the prostate was verified to be 57 cc.  SPECIAL TREATMENT PROCEDURE/SUPERVISION AND HANDLING: The pre-loaded needles were then delivered under sagittal guidance. A total of 26  needles were used to deposit 79 seeds in the prostate gland. The individual seed activity was 0.525 mCi.  SpaceOAR:  Yes  COMPLEX SIMULATION: At the end of the procedure, an anterior radiograph of the pelvis was obtained to document seed positioning and count. Cystoscopy was performed to check the urethra and bladder.  MICRODOSIMETRY: At the end of the procedure, the patient was emitting 0.130 mR/hr at 1 meter. Accordingly, he was considered safe for hospital discharge.  PLAN: The patient will return to the radiation oncology clinic for post implant CT dosimetry in three weeks.   ________________________________  Sheral Apley Tammi Klippel, M.D.

## 2019-02-11 ENCOUNTER — Encounter (HOSPITAL_BASED_OUTPATIENT_CLINIC_OR_DEPARTMENT_OTHER): Payer: Self-pay | Admitting: Urology

## 2019-02-27 ENCOUNTER — Telehealth: Payer: Self-pay | Admitting: *Deleted

## 2019-02-27 NOTE — Telephone Encounter (Signed)
CALLED PATIENT TO REMIND OF POST SEED APPTS. AND MRI OF 02-28-19, SPOKE WITH PATIENT AND HE IS AWARE OF THESE APPTS.

## 2019-02-28 ENCOUNTER — Other Ambulatory Visit: Payer: Self-pay

## 2019-02-28 ENCOUNTER — Ambulatory Visit
Admission: RE | Admit: 2019-02-28 | Discharge: 2019-02-28 | Disposition: A | Discharge status: Home / Self Care | Payer: 59 | Source: Ambulatory Visit | Attending: Radiation Oncology | Admitting: Radiation Oncology

## 2019-02-28 ENCOUNTER — Ambulatory Visit
Admission: RE | Admit: 2019-02-28 | Discharge: 2019-02-28 | Disposition: A | Discharge status: Home / Self Care | Payer: 59 | Source: Ambulatory Visit | Attending: Urology | Admitting: Urology

## 2019-02-28 ENCOUNTER — Encounter: Payer: Self-pay | Admitting: Urology

## 2019-02-28 ENCOUNTER — Encounter: Payer: Self-pay | Admitting: Medical Oncology

## 2019-02-28 ENCOUNTER — Ambulatory Visit (HOSPITAL_COMMUNITY)
Admission: RE | Admit: 2019-02-28 | Discharge: 2019-02-28 | Disposition: A | Discharge status: Home / Self Care | Payer: 59 | Source: Ambulatory Visit | Attending: Urology | Admitting: Urology

## 2019-02-28 VITALS — BP 118/65 | HR 50 | Temp 97.8°F | Resp 18 | Ht 76.0 in | Wt 227.6 lb

## 2019-02-28 DIAGNOSIS — C61 Malignant neoplasm of prostate: Secondary | ICD-10-CM

## 2019-02-28 NOTE — Progress Notes (Signed)
Weight and vitals stable. Denies pain. Post seed IPSS 18. Patient endorses gradual improvement of symptoms since surgery. Reports mild dysuria only at the start of urination. Denies hematuria. Reports scant leakage when sitting in a certain position or bending forward. Scheduled to follow up with urologist on 05/17/2019. Scheduled for MRI to confirm SpaceOar placement at 1600 today. Desire reassurance about resuming sexual activity.   BP 118/65   Pulse (!) 50   Temp 97.8 F (36.6 C)   Resp 18   Ht 6\' 4"  (1.93 m)   Wt 227 lb 9.6 oz (103.2 kg)   SpO2 100%   BMI 27.70 kg/m  Wt Readings from Last 3 Encounters:  02/28/19 227 lb 9.6 oz (103.2 kg)  02/08/19 225 lb (102.1 kg)  10/12/18 238 lb (108 kg)

## 2019-02-28 NOTE — Progress Notes (Signed)
Nicholas Levine states he is doing well post seed implant 9/11. He currently has mild dysuria and frequency but states his urinary symptoms are improving daily. He has follow up with Dr. Alyson Ingles 05/17/19 for PSA and follow up.

## 2019-02-28 NOTE — Progress Notes (Signed)
Radiation Oncology         [phone]) [phone] ________________________________  Name: Nicholas Levine MRN: [id number]  Date: 02/28/2019  DOB: Nicholas Levine  Post-Seed Follow-Up Visit Note  CC: Nicholas Frees, MD  Nicholas Gustin, MD  Diagnosis:   73 y.o. gentleman with Stage T1c adenocarcinoma of the prostate with Gleason score of 4+3 and PSA of 6.11    ICD-10-CM   1. Malignant neoplasm of prostate ([place])  C61     Interval Since Last Radiation:  3 weeks 02/08/19:  Insertion of radioactive I-125 seeds into the prostate gland; 145 Gy, definitive/boost therapy with placement of SpaceOAR gel.  Narrative:  The patient returns today for routine follow-up.  He is complaining of increased urinary frequency and urinary hesitation symptoms. He filled out a questionnaire regarding urinary function today providing and overall IPSS score of 18 characterizing his symptoms as moderate-severe with increased frequency, urgency and occasionally feels that he is not emptying his bladder completely.  He denies suprapubic discomfort, incontinence, fever or chills. He has mild dysuria at the beginning of his stream but denies straining to void or gross hematuria.  His pre-implant score was 2. He experienced some loose stools over the first 1-2 weeks but currently denies any bowel symptoms and feels that his BMs have resumed normal frequency and consistency. Overall, he is pleased with his progress to date.  ALLERGIES:  is allergic to sulfa antibiotics.  Meds: Current Outpatient Medications  Medication Sig Dispense Refill  . levothyroxine (SYNTHROID) 50 MCG tablet Take 150 mcg by mouth daily.    . Multiple Vitamins-Minerals (MULTIVITAMIN ADULT PO) Take by mouth.    . NON FORMULARY OTC prostate supplement    . traMADol (ULTRAM) 50 MG tablet Take 1 tablet (50 mg total) by mouth every 6 (six) hours as needed. 15 tablet 0   No current facility-administered medications for this visit.     Physical Findings: In  general this is a well appearing African American male in no acute distress. He's alert and oriented x4 and appropriate throughout the examination. Cardiopulmonary assessment is negative for acute distress and he exhibits normal effort.   Lab Findings: Lab Results  Component Value Date   WBC 2.8 (L) 02/05/2019   HGB 15.0 02/05/2019   HCT 43.5 02/05/2019   MCV 96.0 02/05/2019   PLT 170 02/05/2019    Radiographic Findings:  Patient underwent CT imaging in our clinic for post implant dosimetry. The CT will be reviewed by Dr. Tammi Levine to confirm there is an adequate distribution of radioactive seeds throughout the prostate gland and ensure that there are no seeds in or near the rectum. His scheduled for prostate MRI at 4pm this afternoon and those images will be fused with his CT images for further evaluation. We suspect the final radiation plan and dosimetry will show appropriate coverage of the prostate gland. He understands that we will call and inform him of any unexpected findings on further review of his imaging and dosimetry.  Impression/Plan: 73 y.o. gentleman with Stage T1c adenocarcinoma of the prostate with Gleason score of 4+3 and PSA of 6.11 The patient is recovering from the effects of radiation. His urinary symptoms should gradually improve over the next 4-6 months. We talked about this today. He is encouraged by his improvement already and is otherwise pleased with his outcome. We also talked about long-term follow-up for prostate cancer following seed implant. He understands that ongoing PSA determinations and digital rectal exams will help perform surveillance to rule out  disease recurrence. He has a follow up appointment scheduled with Dr. Alyson Levine in December 2020. He understands what to expect with his PSA measures. Patient was also educated today about some of the long-term effects from radiation including a small risk for rectal bleeding and possibly erectile dysfunction. We talked  about some of the general management approaches to these potential complications. However, I did encourage the patient to contact our office or return at any point if he has questions or concerns related to his previous radiation and prostate cancer.    Nicholas Johns, PA-C

## 2019-02-28 NOTE — Progress Notes (Signed)
  Radiation Oncology         [phone] ________________________________  Name: Nicholas Levine MRN: [id number]  Date: 02/28/2019  DOB: Feb 21, 1946  COMPLEX SIMULATION NOTE  NARRATIVE:  The patient was brought to the [facility] today following prostate seed implantation approximately one month ago.  Identity was confirmed.  All relevant records and images related to the planned course of therapy were reviewed.  Then, the patient was set-up supine.  CT images were obtained.  The CT images were loaded into the planning software.  Then the prostate and rectum were contoured.  Treatment planning then occurred.  The implanted iodine 125 seeds were identified by the physics staff for projection of radiation distribution  I have requested : 3D Simulation  I have requested a DVH of the following structures: Prostate and rectum.    ________________________________  Sheral Apley Tammi Klippel, M.D.

## 2019-03-19 ENCOUNTER — Encounter: Payer: Self-pay | Admitting: Radiation Oncology

## 2019-03-19 DIAGNOSIS — C61 Malignant neoplasm of prostate: Secondary | ICD-10-CM | POA: Diagnosis not present

## 2019-04-13 NOTE — Progress Notes (Signed)
  Radiation Oncology         [phone]) [phone] ________________________________  Name: Nicholas Levine MRN: [id number]  Date: 03/19/2019  DOB: 03-Sep-1945  3D Planning Note   Prostate Brachytherapy Post-Implant Dosimetry  Diagnosis: 73 y.o. gentleman with Stage T1c adenocarcinoma of the prostate with Gleason score of 4+3 and PSA of 6.11  Narrative: On a previous date, [name] returned following prostate seed implantation for post implant planning. He underwent CT scan complex simulation to delineate the three-dimensional structures of the pelvis and demonstrate the radiation distribution.  Since that time, the seed localization, and complex isodose planning with dose volume histograms have now been completed.  Results:   Prostate Coverage - The dose of radiation delivered to the 90% or more of the prostate gland (D90) was 125.72% of the prescription dose. This exceeds our goal of greater than 90%. Rectal Sparing - The volume of rectal tissue receiving the prescription dose or higher was 0.0 cc. This falls under our thresholds tolerance of 1.0 cc.  Impression: The prostate seed implant appears to show adequate target coverage and appropriate rectal sparing.  Plan:  The patient will continue to follow with urology for ongoing PSA determinations. I would anticipate a high likelihood for local tumor control with minimal risk for rectal morbidity.  ________________________________  Sheral Apley Tammi Klippel, M.D.

## 2019-11-27 ENCOUNTER — Other Ambulatory Visit: Payer: Self-pay

## 2019-11-27 DIAGNOSIS — C61 Malignant neoplasm of prostate: Secondary | ICD-10-CM

## 2020-02-27 IMAGING — DX CHEST - 2 VIEW
2 series · 2 of 2 positions shown · non-contrast
Comparison: None.

CLINICAL DATA: Preop prostatic brachytherapy seed placement.

EXAM:
CHEST - 2 VIEW

[chest pa]
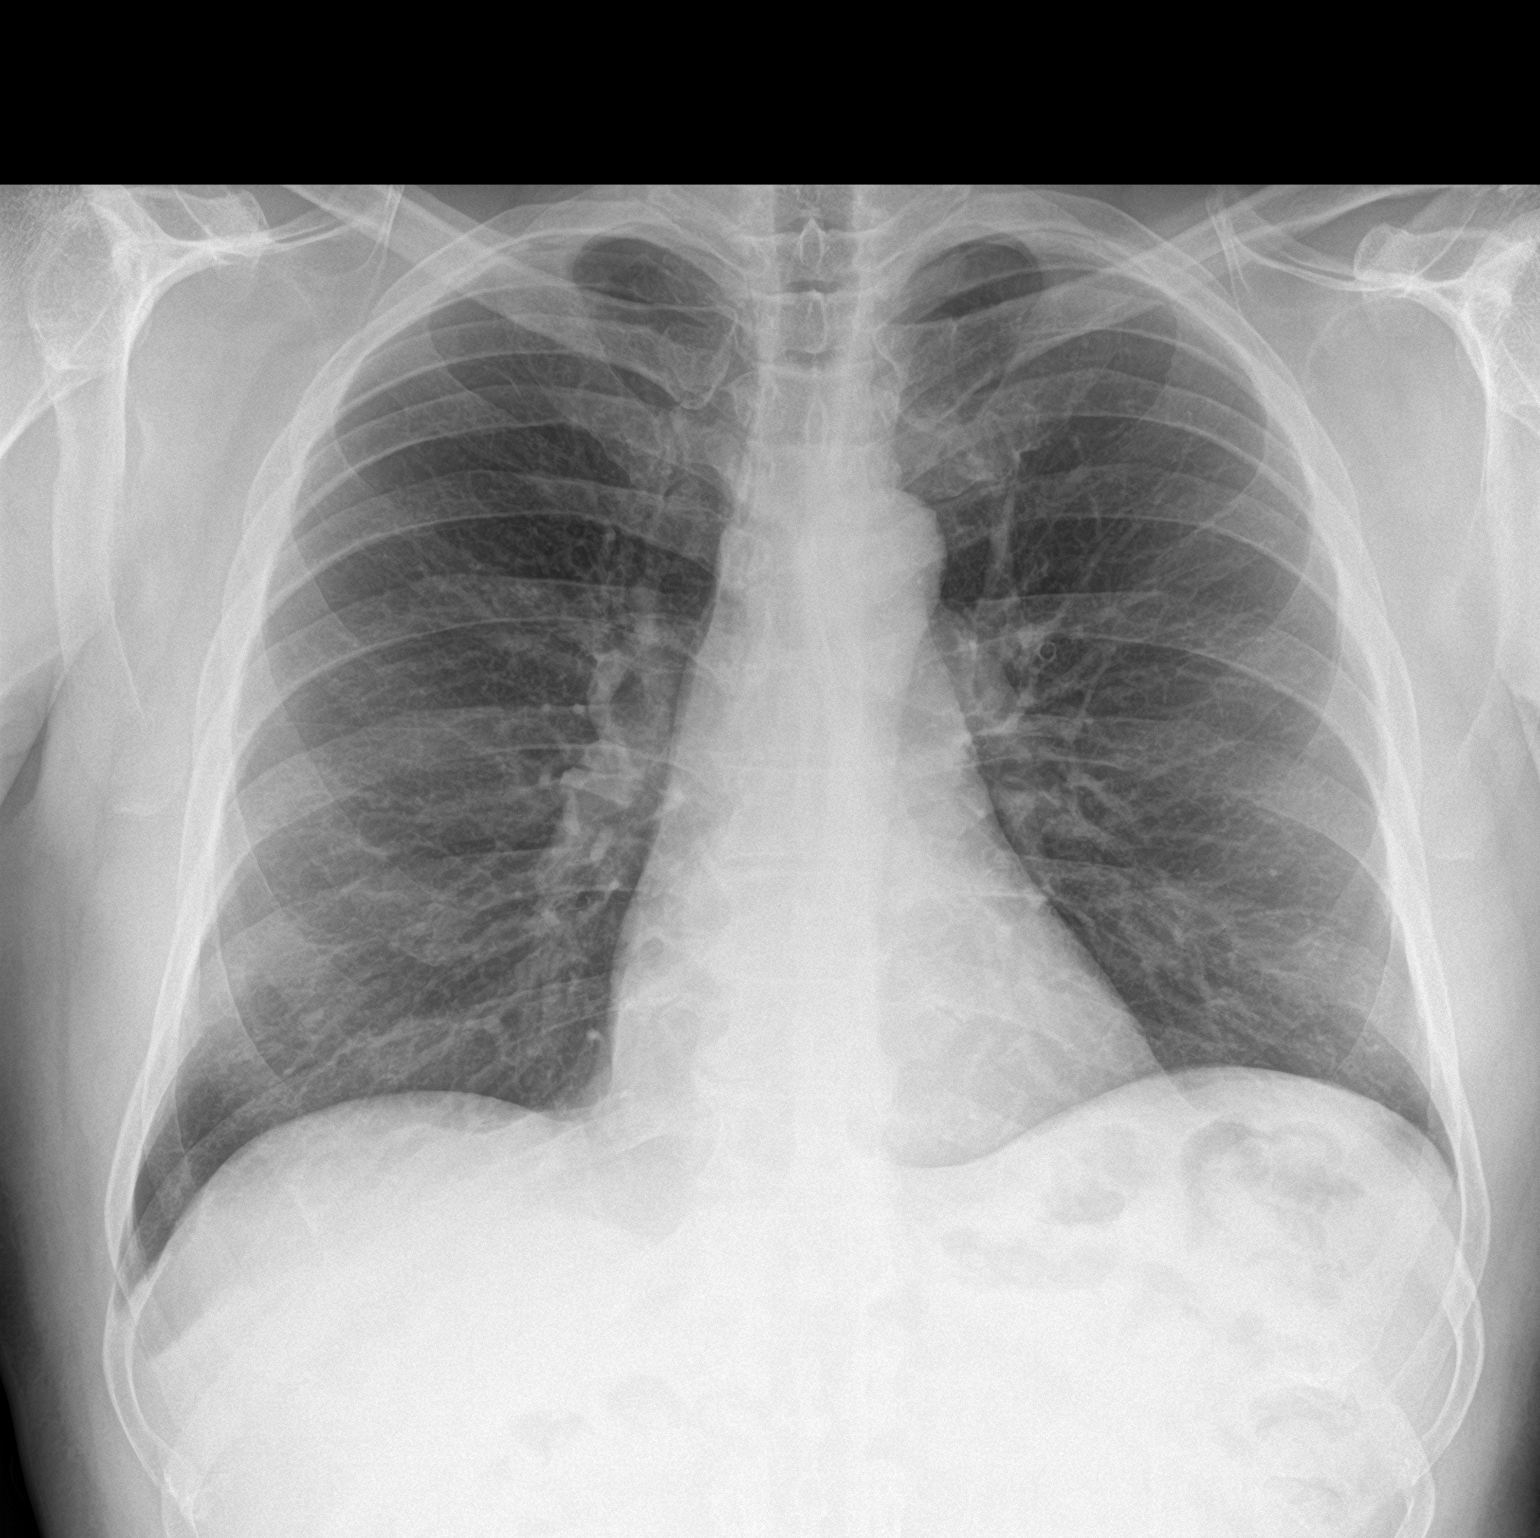

[chest lat]
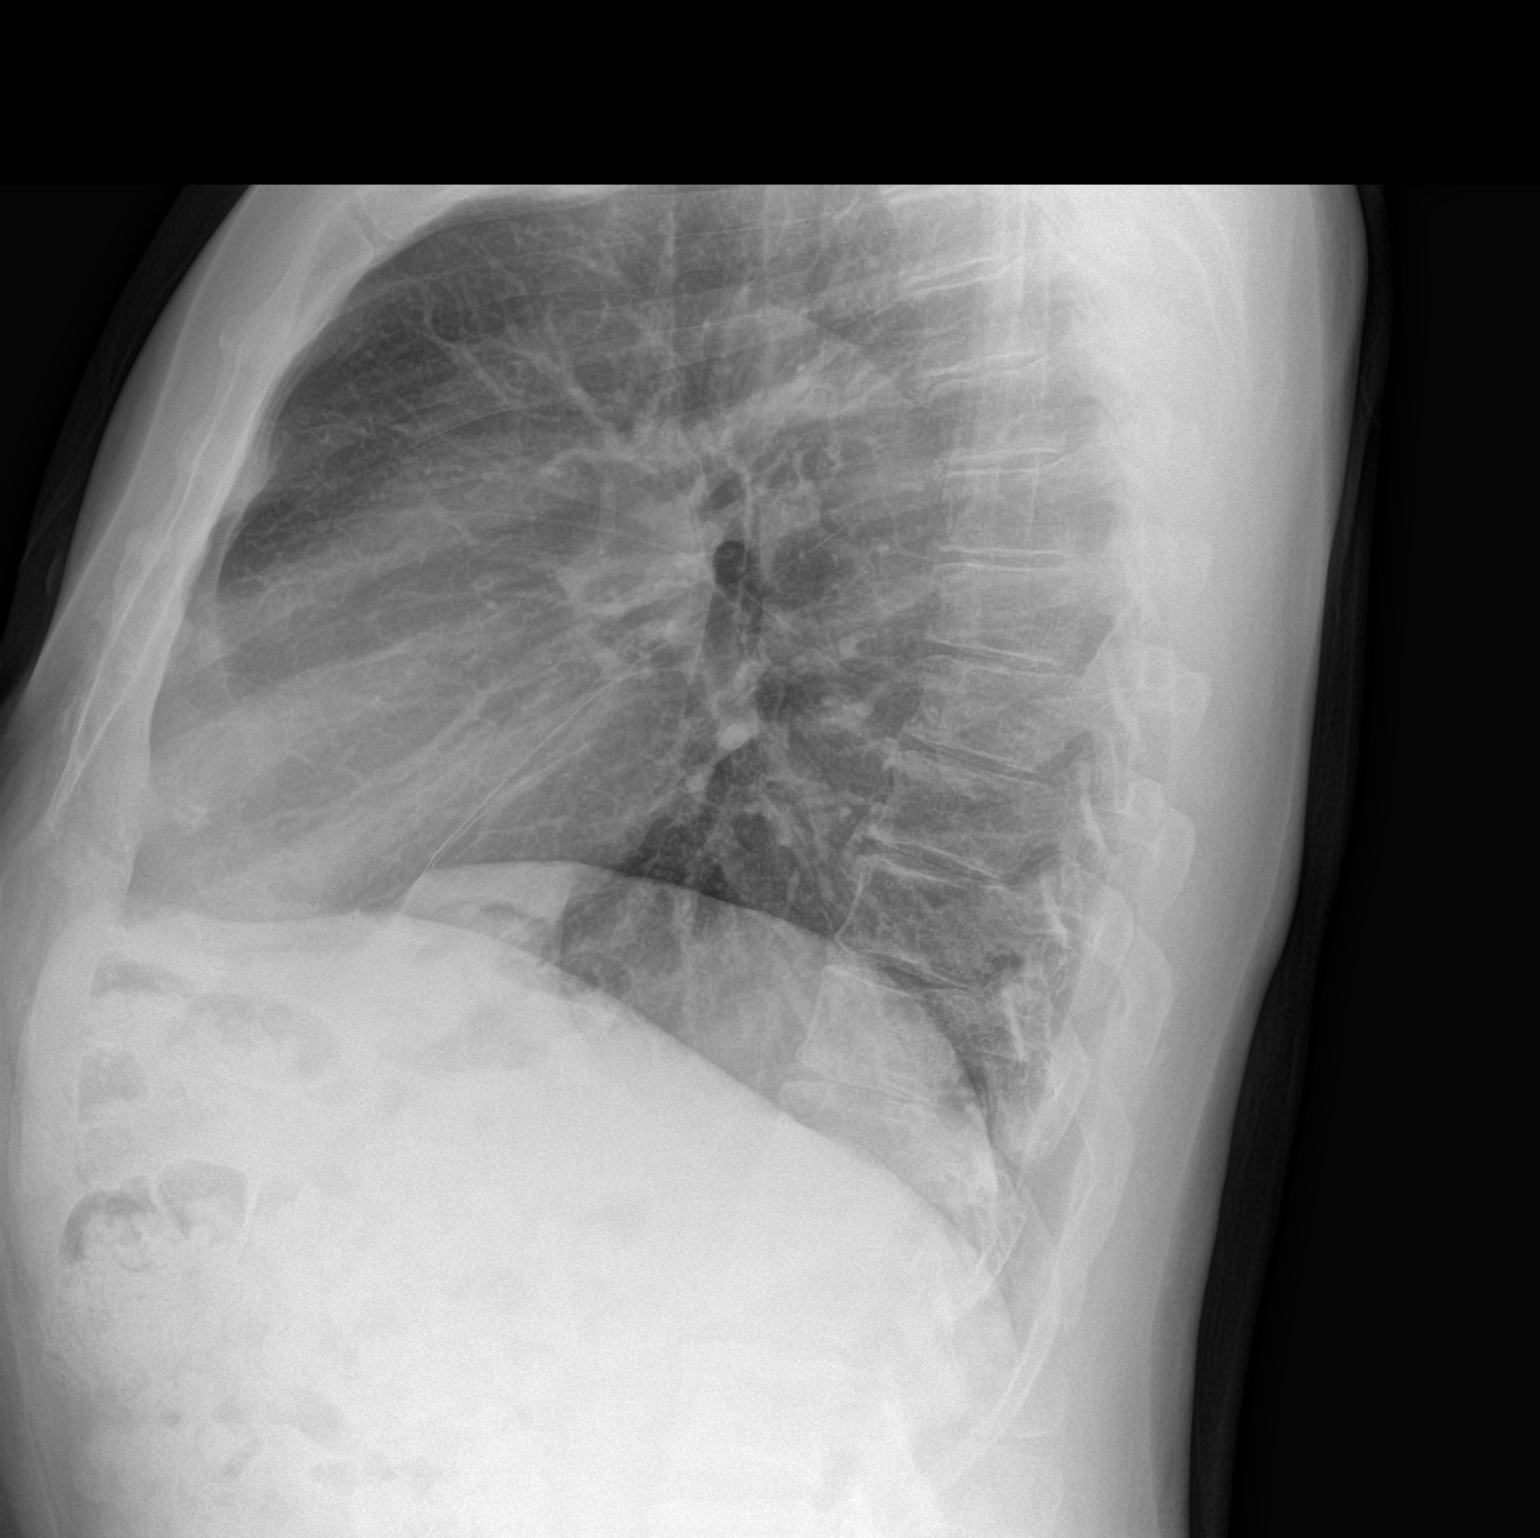

[2 of 2 positions shown; findings below may reference images not displayed]

FINDINGS: The heart size and mediastinal contours are within normal limits.
Both lungs are clear. The visualized skeletal structures are
unremarkable.
IMPRESSION: No active cardiopulmonary disease.

## 2020-05-27 ENCOUNTER — Ambulatory Visit: Payer: 59 | Admitting: Urology

## 2020-07-07 ENCOUNTER — Other Ambulatory Visit: Payer: Self-pay

## 2020-07-07 ENCOUNTER — Ambulatory Visit (INDEPENDENT_AMBULATORY_CARE_PROVIDER_SITE_OTHER): Payer: 59 | Admitting: Urology

## 2020-07-07 ENCOUNTER — Encounter: Payer: Self-pay | Admitting: Urology

## 2020-07-07 VITALS — BP 148/72 | HR 72 | Temp 98.2°F | Ht 76.0 in | Wt 240.6 lb

## 2020-07-07 DIAGNOSIS — C61 Malignant neoplasm of prostate: Secondary | ICD-10-CM

## 2020-07-07 DIAGNOSIS — R300 Dysuria: Secondary | ICD-10-CM | POA: Diagnosis not present

## 2020-07-07 LAB — URINALYSIS, ROUTINE W REFLEX MICROSCOPIC
Bilirubin, UA: NEGATIVE
Glucose, UA: NEGATIVE
Ketones, UA: NEGATIVE
Leukocytes,UA: NEGATIVE
Nitrite, UA: NEGATIVE
RBC, UA: NEGATIVE
Specific Gravity, UA: 1.02 (ref 1.005–1.030)
Urobilinogen, Ur: 1 mg/dL (ref 0.2–1.0)
pH, UA: 6 (ref 5.0–7.5)

## 2020-07-07 LAB — BLADDER SCAN AMB NON-IMAGING: Scan Result: 81

## 2020-07-07 NOTE — Progress Notes (Addendum)
07/07/2020 2:54 PM   Karis Juba March 07, 1946 [id number]  Referring provider: Shirline Frees, MD [place],   [id number]  Followup prostate cancer  HPI: Mr Bergen is a 75yo here for followup for prostate cancer and dysuria. Last PSA was 0.32.  Dysuria resolved since last visit. No recent PSA. He had his brachytherapy in 01/2019. He has mild LUTS and he is not bothered by his LUTS.    PMH: Past Medical History:  Diagnosis Date  . Hypothyroidism   . Prostate cancer [facility])     Surgical History: Past Surgical History:  Procedure Laterality Date  . PROSTATE BIOPSY    . radioactive iodine  1975   to slow thryoid  . RADIOACTIVE SEED IMPLANT N/A 02/08/2019   Procedure: RADIOACTIVE SEED IMPLANT/BRACHYTHERAPY IMPLANT;  Surgeon: Cleon Gustin, MD;  Location: [facility];  Service: Urology;  Laterality: N/A;  . right knee arthroscopy  1971   exploratory surgery for ligement repair  . SPACE OAR INSTILLATION N/A 02/08/2019   Procedure: SPACE OAR INSTILLATION;  Surgeon: Cleon Gustin, MD;  [facility];  Service: Urology;  Laterality: N/A;    Home Medications:  Allergies as of 07/07/2020      Reactions   Sulfa Antibiotics       Medication List       Accurate as of July 07, 2020  2:54 PM. If you have any questions, ask your nurse or doctor.        levothyroxine 50 MCG tablet Commonly known as: SYNTHROID Take 150 mcg by mouth daily.   MULTIVITAMIN ADULT PO Take by mouth.   NON FORMULARY OTC prostate supplement       Allergies:  Allergies  Allergen Reactions  . Sulfa Antibiotics     Family History: Family History  Problem Relation Age of Onset  . Leukemia Maternal Aunt   . Leukemia Paternal Aunt     Social History:  reports that he has quit smoking. His smoking use included cigarettes. He has a 3.25 pack-year smoking history. He has never used smokeless tobacco. He reports  previous alcohol use. He reports that he does not use drugs.  ROS: All other review of systems were reviewed and are negative except what is noted above in HPI  Physical Exam: BP (!) 148/72   Pulse 72   Temp 98.2 F (36.8 C)   Ht 6\' 4"  (1.93 m)   Wt 240 lb 9.6 oz (109.1 kg)   BMI 29.29 kg/m   Constitutional:  Alert and oriented, No acute distress. HEENT:  AT, moist mucus membranes.  Trachea midline, no masses. Cardiovascular: No clubbing, cyanosis, or edema. Respiratory: Normal respiratory effort, no increased work of breathing. GI: Abdomen is soft, nontender, nondistended, no abdominal masses GU: No CVA tenderness.  Lymph: No cervical or inguinal lymphadenopathy. Skin: No rashes, bruises or suspicious lesions. Neurologic: Grossly intact, no focal deficits, moving all 4 extremities. Psychiatric: Normal mood and affect.  Laboratory Data: Lab Results  Component Value Date   WBC 2.8 (L) 02/05/2019   HGB 15.0 02/05/2019   HCT 43.5 02/05/2019   MCV 96.0 02/05/2019   PLT 170 02/05/2019    Lab Results  Component Value Date   CREATININE 0.93 02/05/2019    No results found for: PSA  No results found for: TESTOSTERONE  No results found for: HGBA1C  Urinalysis No results found for: COLORURINE, APPEARANCEUR, [place], [place], [place], [place], [place], KETONESUR, PROTEINUR, [place], NITRITE, LEUKOCYTESUR  No results found for: LABMICR, [place], RBCUA, LABEPIT, MUCUS, BACTERIA  Pertinent Imaging:  No results found for this or any previous visit.  No results found for this or any previous visit.  No results found for this or any previous visit.  No results found for this or any previous visit.  No results found for this or any previous visit.  No results found for this or any previous visit.  No results found for this or any previous visit.  No results found for this or any previous visit.   Assessment & Plan:    1. Prostate cancer ([place]) -PSA today.  RTC 6 months with PSA - Urinalysis, Routine w reflex microscopic - BLADDER SCAN AMB NON-IMAGING  2. Dysuria resolved   No follow-ups on file.  Nicolette Bang, MD  [facility] [place]

## 2020-07-07 NOTE — Progress Notes (Signed)
Bladder Scan Patient can void: 81 ml Performed By: Braysen Cloward,lpn   Urological Symptom Review  Patient is experiencing the following symptoms: Frequent urination Get up at night to urinate Leakage of urine   Review of Systems  Gastrointestinal (upper)  : Negative for upper GI symptoms  Gastrointestinal (lower) : Constipation  Constitutional : Negative for symptoms  Skin: Skin rash/lesion  itching Eyes: Negative for eye symptoms  Ear/Nose/Throat : Sinus problems  Hematologic/Lymphatic: Negative for Hematologic/Lymphatic symptoms  Cardiovascular : Negative for cardiovascular symptoms  Respiratory : Negative for respiratory symptoms  Endocrine: Negative for endocrine symptoms  Musculoskeletal: Joint pain  Neurological: Negative for neurological symptoms  Psychologic: Negative for psychiatric symptoms

## 2020-07-07 NOTE — Patient Instructions (Signed)
Prostate Cancer  The prostate is a male gland that helps make semen. It is located below a man's bladder, in front of the rectum. Prostate cancer is when abnormal cells grow in this gland. What are the causes? The cause of this condition is not known. What increases the risk? You are more likely to develop this condition if:  You are 73 years of age or older.  You are African American.  You have a family history of prostate cancer.  You have a family history of breast cancer. What are the signs or symptoms? Symptoms of this condition include:  A need to pee often.  Peeing that is weak, or pee that stops and starts.  Trouble starting or stopping your pee.  Inability to pee.  Blood in your pee or semen.  Pain in the lower back, lower belly (abdomen), hips, or upper thighs.  Trouble getting an erection.  Trouble emptying all of your pee. How is this treated? Treatment for this condition depends on your age, your health, the kind of treatment you like, and how far the cancer has spread. Treatments include:  Being watched. This is called observation. You will be tested from time to time, but you will not get treated. Tests are to make sure that the cancer is not growing.  Surgery. This may be done to remove the prostate, to remove the testicles, or to freeze or kill cancer cells.  Radiation. This uses a strong beam to kill cancer cells.  Ultrasound energy. This uses strong sound waves to kill cancer cells.  Chemotherapy. This uses medicines that stop cancer cells from increasing. This kills cancer cells and healthy cells.  Targeted therapy. This kills cancer cells only. Healthy cells are not affected.  Hormone treatment. This stops the body from making hormones that help the cancer cells to grow. Follow these instructions at home:  Take over-the-counter and prescription medicines only as told by your doctor.  Eat a healthy diet.  Get plenty of sleep.  Ask your  doctor for help to find a support group for men with prostate cancer.  If you have to go to the hospital, let your cancer doctor (oncologist) know.  Treatment may affect your ability to have sex. Touch, hold, hug, and caress your partner to have intimate moments.  Keep all follow-up visits as told by your doctor. This is important. Contact a doctor if:  You have new or more trouble peeing.  You have new or more blood in your pee.  You have new or more pain in your hips, back, or chest. Get help right away if:  You have weakness in your legs.  You lose feeling in your legs.  You cannot control your pee or your poop (stool).  You have chills or a fever. Summary  The prostate is a male gland that helps make semen.  Prostate cancer is when abnormal cells grow in this gland.  Treatment includes doing surgery, using medicines, using very strong beams, or watching without treatment.  Ask your doctor for help to find a support group for men with prostate cancer.  Contact a doctor if you have problems peeing or have any new pain that you did not have before. This information is not intended to replace advice given to you by your health care provider. Make sure you discuss any questions you have with your health care provider. Document Revised: 04/30/2019 Document Reviewed: 04/30/2019 Elsevier Patient Education  2021 Elsevier Inc.  

## 2020-07-08 LAB — PSA: Prostate Specific Ag, Serum: 0.3 ng/mL (ref 0.0–4.0)

## 2020-12-28 ENCOUNTER — Telehealth: Payer: Self-pay

## 2020-12-28 NOTE — Telephone Encounter (Signed)
Patient needing to know PSA from last lab visit.  Please advise.  Call back:  [phone]  Thanks, [name]

## 2020-12-28 NOTE — Telephone Encounter (Signed)
PSA result from February reviewed with patient- patient has upcoming lab appt. Patient voiced understanding.

## 2020-12-31 ENCOUNTER — Other Ambulatory Visit: Payer: 59

## 2020-12-31 ENCOUNTER — Other Ambulatory Visit: Payer: Self-pay

## 2020-12-31 DIAGNOSIS — C61 Malignant neoplasm of prostate: Secondary | ICD-10-CM

## 2021-01-01 LAB — PSA: Prostate Specific Ag, Serum: 0.2 ng/mL (ref 0.0–4.0)

## 2021-01-08 ENCOUNTER — Other Ambulatory Visit: Payer: Self-pay

## 2021-01-08 ENCOUNTER — Telehealth (INDEPENDENT_AMBULATORY_CARE_PROVIDER_SITE_OTHER): Payer: 59 | Admitting: Urology

## 2021-01-08 ENCOUNTER — Encounter: Payer: Self-pay | Admitting: Urology

## 2021-01-08 DIAGNOSIS — C61 Malignant neoplasm of prostate: Secondary | ICD-10-CM

## 2021-01-08 NOTE — Patient Instructions (Signed)
Prostate Cancer  The prostate is a small gland (1.5 inches [3.8 cm] wide and 1 inch [2.5 cm] high) that is involved in the production of semen. It is located below a man's bladder, in front of the rectum. Prostate cancer is the abnormal growth ofcells in the prostate gland. What are the causes? The exact cause of this condition is not known. What increases the risk? You are more likely to develop this condition if: You are 73 years of age or older. You are African American. You have a family history of prostate cancer. You have a family history of breast cancer. What are the signs or symptoms? Symptoms of this condition include: A need to urinate often. Weak or interrupted flow of urine. Trouble starting or stopping urination. Inability to urinate. Blood in urine or semen. Persistent pain or discomfort in the lower back, lower abdomen, hips, or upper thighs. Trouble getting an erection. Trouble emptying the bladder all the way. How is this diagnosed? This condition can be diagnosed with: A digital rectal exam. For this exam, a health care provider inserts a gloved finger into the rectum to feel the prostate gland. A blood test called a prostate-specific antigen (PSA) test. A procedure in which a sample of tissue is taken from the prostate and checked under a microscope (prostate biopsy). An imaging test called transrectal ultrasonography. Once the condition is diagnosed, tests will be done to determine how far the cancer has spread. This is called staging the cancer. Staging may involve imaging tests, such as: A bone scan. A CT scan. A PET scan. An MRI. The stages of prostate cancer are as follows: Stage I. At this stage, the cancer is found in the prostate only. The cancer is not visible on imaging tests, and it is usually found by accident, such as during prostate surgery. Stage II. At this stage, the cancer is more advanced than it is in stage I, but the cancer has not spread  outside the prostate. Stage III. At this stage, the cancer has spread beyond the outer layer of the prostate to nearby tissues. The cancer may be found in the seminal vesicles, which are near the bladder and the prostate. Stage IV. At this stage, the cancer has spread to other parts of the body, such as the lymph nodes, bones, bladder, rectum, liver, or lungs. How is this treated? Treatment for this condition depends on several factors, including the stage of the cancer, your age, personal preferences, and your overall health. Talk with your health care provider about treatment options that are recommended for you. Common treatments include: Observation for early stage prostate cancer (active surveillance). This involves having exams, blood tests, and in some cases, more biopsies. For some men, this is the only treatment needed. Surgery. Types of surgeries include: Open surgery (prostatectomy). In this surgery, a larger incision is made to remove the prostate. A laparoscopic prostatectomy. This is a surgery to remove the prostate and lymph nodes through several, small incisions. It is often referred to as a minimally invasive surgery. A robotic prostatectomy. This is laparoscopic surgery to remove the prostate and lymph nodes with the help of robotic arms that are controlled by the surgeon. Orchiectomy. This is surgery to remove the testicles. Cryosurgery. This is surgery to freeze and destroy cancer cells. Radiation treatment. Types of radiation treatment include: External beam radiation. This type aims beams of radiation from outside the body at the prostate to destroy cancerous cells. Brachytherapy. This type uses radioactive needles,   seeds, wires, or tubes that are implanted into the prostate gland. Like external beam radiation, brachytherapy destroys cancerous cells. An advantage is that this type of radiation limits the damage to surrounding tissue and has fewer side effects. High-intensity,  focused ultrasonography. This treatment destroys cancer cells by delivering high-energy ultrasound waves to the cancerous cells. Chemotherapy medicines. This treatment kills cancer cells or stops them from multiplying. It kills both cancer cells and normal cells. Targeted therapy. This treatment uses medicines to kill cancer cells without damaging normal cells. Hormone treatment. This treatment involves taking medicines that act on one of the male hormones (testosterone): By stopping your body from producing testosterone. By blocking testosterone from reaching cancer cells. Follow these instructions at home: Take over-the-counter and prescription medicines only as told by your health care provider. Maintain a healthy diet. Get plenty of sleep. Consider joining a support group for men who have prostate cancer. Meeting with a support group may help you learn to manage the stress of having cancer. If you have to go to the hospital, notify your cancer specialist (oncologist). Treatment for prostate cancer may affect sexual function. Continue to have intimate moments with your partner. This may include touching, holding, hugging, and caressing. Keep all follow-up visits as told by your health care provider. This is important. Contact a health care provider if: You have new or increasing trouble urinating. You have new or increasing blood in your urine. You have new or increasing pain in your hips, back, or chest. Get help right away if: You have weakness or numbness in your legs. You cannot control urination or your bowel movements (incontinence). You have chills or a fever. Summary The prostate is a small gland that is involved in the production of semen. It is located below a man's bladder, in front of the rectum. Prostate cancer is the abnormal growth of cells in the prostate gland. Treatment for this condition depends on the stage of the cancer, your age, personal preferences, and your  overall health. Talk with your health care provider about treatment options that are recommended for you. Consider joining a support group for men who have prostate cancer. Meeting with a support group may help you learn to cope with the stress of having cancer. This information is not intended to replace advice given to you by your health care provider. Make sure you discuss any questions you have with your healthcare provider. Document Revised: 04/30/2019 Document Reviewed: 04/30/2019 Elsevier Patient Education  2022 Elsevier Inc.  

## 2021-01-08 NOTE — Progress Notes (Signed)
01/08/2021 2:18 PM   Nicholas Levine [birth year] [id number]  Referring provider: Shirline Frees, MD [place],  [place] [address]  Patient location: home Physician location: office I connected with  Nicholas Levine on 01/08/21 by a video enabled telemedicine application and verified that I am speaking with the correct person using two identifiers.   I discussed the limitations of evaluation and management by telemedicine. The patient expressed understanding and agreed to proceed.    Followup Prostate cancer   HPI: Nicholas Levine is a 75yo here for followup for prostate cancer. PSA decreased to 0.2. He denies any worsening LUTS. He does have intermittent weak urinary stream and he has intermittent burning with urination. He has rectal pressure with urinating. He is not bothered by his LUTS. He had brachytherapy in 01/2019. No other complaints today   PMH: Past Medical History:  Diagnosis Date   Hypothyroidism    Prostate cancer [facility])     Surgical History: Past Surgical History:  Procedure Laterality Date   PROSTATE BIOPSY     radioactive iodine  1975   to slow thryoid   RADIOACTIVE SEED IMPLANT N/A 02/08/2019   Procedure: RADIOACTIVE SEED IMPLANT/BRACHYTHERAPY IMPLANT;  Surgeon: Cleon Gustin, MD;  Location: [facility];  Service: Urology;  Laterality: N/A;   right knee arthroscopy  1971   exploratory surgery for ligement repair   SPACE OAR INSTILLATION N/A 02/08/2019   Procedure: SPACE OAR INSTILLATION;  Surgeon: Cleon Gustin, MD;  Location: [facility];  Service: Urology;  Laterality: N/A;    Home Medications:  Allergies as of 01/08/2021       Reactions   Sulfa Antibiotics         Medication List        Accurate as of January 08, 2021  2:18 PM. If you have any questions, ask your nurse or doctor.          levothyroxine 50 MCG tablet Commonly known as: SYNTHROID Take 150 mcg by mouth daily.    MULTIVITAMIN ADULT PO Take by mouth.   NON FORMULARY OTC prostate supplement        Allergies:  Allergies  Allergen Reactions   Sulfa Antibiotics     Family History: Family History  Problem Relation Age of Onset   Leukemia Maternal Aunt    Leukemia Paternal Aunt     Social History:  reports that he has quit smoking. His smoking use included cigarettes. He has a 3.25 pack-year smoking history. He has never used smokeless tobacco. He reports that he does not currently use alcohol. He reports that he does not use drugs.  ROS: All other review of systems were reviewed and are negative except what is noted above in HPI   Laboratory Data: Lab Results  Component Value Date   WBC 2.8 (L) 02/05/2019   HGB 15.0 02/05/2019   HCT 43.5 02/05/2019   MCV 96.0 02/05/2019   PLT 170 02/05/2019    Lab Results  Component Value Date   CREATININE 0.93 02/05/2019    No results found for: PSA  No results found for: TESTOSTERONE  No results found for: HGBA1C  Urinalysis    Component Value Date/Time   APPEARANCEUR Clear 07/07/2020 1509   GLUCOSEU Negative 07/07/2020 1509   BILIRUBINUR Negative 07/07/2020 1509   PROTEINUR Trace (A) 07/07/2020 1509   NITRITE Negative 07/07/2020 1509   LEUKOCYTESUR Negative 07/07/2020 1509    Lab Results  Component Value Date  LABMICR Comment 07/07/2020    Pertinent Imaging:  No results found for this or any previous visit.  No results found for this or any previous visit.  No results found for this or any previous visit.  No results found for this or any previous visit. No results found for this or any previous visit.  No results found for this or any previous visit.  No results found for this or any previous visit.  No results found for this or any previous visit.   Assessment & Plan:    1. Prostate cancer ([place]) -RTC 6 months with PSA   No follow-ups on file.  Nicolette Bang, MD  [facility] Urology [place]

## 2021-04-09 ENCOUNTER — Ambulatory Visit: Payer: 59 | Attending: Orthopedic Surgery | Admitting: Physical Therapy

## 2021-04-09 ENCOUNTER — Encounter: Payer: Self-pay | Admitting: Physical Therapy

## 2021-04-09 ENCOUNTER — Other Ambulatory Visit: Payer: Self-pay

## 2021-04-09 DIAGNOSIS — R600 Localized edema: Secondary | ICD-10-CM | POA: Diagnosis present

## 2021-04-09 DIAGNOSIS — R262 Difficulty in walking, not elsewhere classified: Secondary | ICD-10-CM

## 2021-04-09 DIAGNOSIS — M25661 Stiffness of right knee, not elsewhere classified: Secondary | ICD-10-CM

## 2021-04-09 DIAGNOSIS — M25561 Pain in right knee: Secondary | ICD-10-CM | POA: Diagnosis present

## 2021-04-09 NOTE — Therapy (Signed)
[address] [place], [place], [address] Phone: [phone]   Fax:  [phone]  Physical Therapy Evaluation  Patient Details  Name: Nicholas Levine MRN: [id number] Date of Birth: 1945/08/18 Referring Provider (PT): Aluisio   Encounter Date: 04/09/2021   PT End of Session - 04/09/21 1151     Visit Number 1    Number of Visits 30    Date for PT Re-Evaluation 06/09/21    PT Start Time 1100    PT Stop Time 1150    PT Time Calculation (min) 50 min    Activity Tolerance Patient tolerated treatment well    Behavior During Therapy [facility] for tasks assessed/performed             Past Medical History:  Diagnosis Date   Hypothyroidism    Prostate cancer [facility])     Past Surgical History:  Procedure Laterality Date   PROSTATE BIOPSY     radioactive iodine  1975   to slow thryoid   RADIOACTIVE SEED IMPLANT N/A 02/08/2019   Procedure: RADIOACTIVE SEED IMPLANT/BRACHYTHERAPY IMPLANT;  Surgeon: Cleon Gustin, MD;  Location: [facility];  Service: Urology;  Laterality: N/A;   right knee arthroscopy  1971   exploratory surgery for ligement repair   SPACE OAR INSTILLATION N/A 02/08/2019   Procedure: SPACE OAR INSTILLATION;  Surgeon: Cleon Gustin, MD;  Location: [facility];  Service: Urology;  Laterality: N/A;    There were no vitals filed for this visit.    Subjective Assessment - 04/09/21 1116     Subjective Reports that he has had a bad right knee for a number of years, reports that he underwent a right TKA on 04/07/21.  Reports that he has not been able to prop the leg up due to pain a 10/10.  REports that really had trouble with the blocks wearing off    Limitations Lifting;Sitting;Standing;Walking;House hold activities    Patient Stated Goals stronger, less pain, walk better    Currently in Pain? Yes    Pain Score 4     Pain Location Knee    Pain Orientation Right     Pain Descriptors / Indicators Aching;Tightness;Sore    Pain Type Surgical pain    Pain Onset In the past 7 days    Pain Frequency Constant    Aggravating Factors  bending, walking pain up to 8/10    Pain Relieving Factors icep, pain meds pain can be a 2/10    Effect of Pain on Daily Activities limits everything                [facility] PT Assessment - 04/09/21 0001       Assessment   Medical Diagnosis s/p rihgt TKA    Referring Provider (PT) Aluisio    Onset Date/Surgical Date 04/07/21    Prior Therapy no      Precautions   Precautions None      Balance Screen   Has the patient fallen in the past 6 months No    Has the patient had a decrease in activity level because of a fear of falling?  No    Is the patient reluctant to leave their home because of a fear of falling?  No      Home Environment   Additional Comments 3 steps into the home      Prior Function   Level of Independence Independent    Vocation Full time  employment    Vocation Requirements a lot of driving    Leisure some situps and pushups      Observation/Other Assessments-Edema    Edema Circumferential      Circumferential Edema   Circumferential - Right 52 cm    Circumferential - Left  46 cm      ROM / Strength   AROM / PROM / Strength AROM;PROM;Strength      AROM   AROM Assessment Site Knee    Right/Left Knee Right    Right Knee Extension 28    Right Knee Flexion 70      PROM   PROM Assessment Site Knee    Right/Left Knee Right    Right Knee Extension 20    Right Knee Flexion 75      Strength   Overall Strength Comments in the range can give 3+/5      Palpation   Palpation comment tender swollen bandage on      Ambulation/Gait   Gait Comments with rolling walker, slow and step to gait      Standardized Balance Assessment   Standardized Balance Assessment Timed Up and Go Test      Timed Up and Go Test   Normal TUG (seconds) 36    TUG Comments with FWW                         Objective measurements completed on examination: See above findings.       [facility] Adult PT Treatment/Exercise - 04/09/21 0001       Exercises   Exercises Knee/Hip      Knee/Hip Exercises: Aerobic   Nustep Level 3 x 6 minutes                     PT Education - 04/09/21 1155     Education Details LLLD stretches for extension and flexion, ice, elevation    Person(s) Educated Patient;Spouse    Methods Explanation;Demonstration;Handout    Comprehension Verbalized understanding              PT Short Term Goals - 04/09/21 1154       PT SHORT TERM GOAL #1   Title independent iwth initial HEP    Time 2    Period Weeks    Status New               PT Long Term Goals - 04/09/21 1155       PT LONG TERM GOAL #1   Title walk with SPC or LRAD x 500 feet    Time 12    Period Weeks    Status New      PT LONG TERM GOAL #2   Title increase AROM to 5-115 degrees flexion    Time 12    Period Weeks    Status New      PT LONG TERM GOAL #3   Title decrease TUG time to 13 seconds    Time 12    Period Weeks    Status New      PT LONG TERM GOAL #4   Title go up and down stairs step over step    Time 12    Period Weeks    Status New                    Plan - 04/09/21 1152     Clinical Impression Statement Patient underwent a right  TKA on 04/07/21, he reports some difficulty with pain yesterday but feels that he has medicated correctly today, using a FWW, step to pattern, very stiff, AROM 28-70 degrees flexion.  Significant swelling.  He is tending to use the arms and the other leg to left the right leg up to bed and in and out of car    Stability/Clinical Decision Making Evolving/Moderate complexity    Clinical Decision Making Low    Rehab Potential Good    PT Frequency 2x / week   2-3x/week   PT Duration 12 weeks    PT Next Visit Plan advance ROM, strenfth, funcitonal gait    Consulted and Agree with Plan of Care  Patient             Patient will benefit from skilled therapeutic intervention in order to improve the following deficits and impairments:  Abnormal gait, Decreased range of motion, Difficulty walking, Pain, Decreased balance, Decreased scar mobility, Impaired flexibility, Increased edema, Decreased strength, Decreased mobility  Visit Diagnosis: Acute pain of right knee - Plan: PT plan of care cert/re-cert  Stiffness of right knee, not elsewhere classified - Plan: PT plan of care cert/re-cert  Difficulty in walking, not elsewhere classified - Plan: PT plan of care cert/re-cert  Localized edema - Plan: PT plan of care cert/re-cert     Problem List Patient Active Problem List   Diagnosis Date Noted   Malignant neoplasm of prostate ([place]) 10/14/2018    Sumner Boast, PT 04/09/2021, 11:58 AM  [address]. [place], [place], [address] Phone: [phone]   Fax:  [phone]  Name: Nicholas Levine MRN: [id number] Date of Birth: [birth year]

## 2021-04-12 ENCOUNTER — Ambulatory Visit: Payer: 59 | Admitting: Physical Therapy

## 2021-04-12 ENCOUNTER — Other Ambulatory Visit: Payer: Self-pay

## 2021-04-12 ENCOUNTER — Encounter: Payer: Self-pay | Admitting: Physical Therapy

## 2021-04-12 DIAGNOSIS — M25561 Pain in right knee: Secondary | ICD-10-CM

## 2021-04-12 DIAGNOSIS — M25661 Stiffness of right knee, not elsewhere classified: Secondary | ICD-10-CM

## 2021-04-12 DIAGNOSIS — R600 Localized edema: Secondary | ICD-10-CM

## 2021-04-12 DIAGNOSIS — R262 Difficulty in walking, not elsewhere classified: Secondary | ICD-10-CM

## 2021-04-12 NOTE — Therapy (Signed)
[address]. [place], [place], [address] Phone: [phone]   Fax:  [phone]  Physical Therapy Treatment  Patient Details  Name: Nicholas Levine MRN: [id number] Date of Birth: 1946/02/25 Referring Provider (PT): Aluisio   Encounter Date: 04/12/2021   PT End of Session - 04/12/21 1636     Visit Number 2    Number of Visits 30    Date for PT Re-Evaluation 06/09/21    PT Start Time 6294   arrived late   PT Stop Time 1614    PT Time Calculation (min) 28 min    Activity Tolerance Patient tolerated treatment well    Behavior During Therapy [facility] for tasks assessed/performed             Past Medical History:  Diagnosis Date   Hypothyroidism    Prostate cancer [facility])     Past Surgical History:  Procedure Laterality Date   PROSTATE BIOPSY     radioactive iodine  1975   to slow thryoid   RADIOACTIVE SEED IMPLANT N/A 02/08/2019   Procedure: RADIOACTIVE SEED IMPLANT/BRACHYTHERAPY IMPLANT;  Surgeon: Cleon Gustin, MD;  Location: [facility];  Service: Urology;  Laterality: N/A;   right knee arthroscopy  1971   exploratory surgery for ligement repair   SPACE OAR INSTILLATION N/A 02/08/2019   Procedure: SPACE OAR INSTILLATION;  Surgeon: Cleon Gustin, MD;  Location: [facility];  Service: Urology;  Laterality: N/A;    There were no vitals filed for this visit.   Subjective Assessment - 04/12/21 1547     Subjective My knee is feeling a little better in terms of soreness and its been itching a bit too; I was able to put my foot up on the back of the sofa today too, which I could not do earlier. I do feel like it is getting better since last time I came.    Patient Stated Goals stronger, less pain, walk better    Currently in Pain? Yes    Pain Score 0-No pain   but can get up 1/10 when up and moving, 3-4/10 when elevating leg                               [facility] Adult PT Treatment/Exercise - 04/12/21 0001       Manual Therapy   Manual Therapy Edema management;Joint mobilization;Passive ROM    Edema Management retrograde massage with LE elevated    Joint Mobilization patella mobility all directions    Passive ROM extension and flexion as tolerated                     PT Education - 04/12/21 1635     Education Details ice/elevation routine, importance of ROM work, spiral ace wraps vs circumferential wrapping    Person(s) Educated Patient;Spouse    Methods Explanation    Comprehension Verbalized understanding;Returned demonstration              PT Short Term Goals - 04/09/21 1154       PT SHORT TERM GOAL #1   Title independent iwth initial HEP    Time 2    Period Weeks    Status New               PT Long Term Goals - 04/09/21 1155       PT LONG TERM GOAL #1  Title walk with SPC or LRAD x 500 feet    Time 12    Period Weeks    Status New      PT LONG TERM GOAL #2   Title increase AROM to 5-115 degrees flexion    Time 12    Period Weeks    Status New      PT LONG TERM GOAL #3   Title decrease TUG time to 13 seconds    Time 12    Period Weeks    Status New      PT LONG TERM GOAL #4   Title go up and down stairs step over step    Time 12    Period Weeks    Status New                   Plan - 04/12/21 1636     Clinical Impression Statement [name] arrive late today, but pleasant and very talkative/needed lots of redirection today ; reports he has had some bleeding from his incision with knee flexion, scheduled to see MD tomorrow to ask about the bleeding. Due to time restrictions of session, focused on manual interventions to improve edema and ROM today. Tolerated well with no extra bleeding noted from knee incision- also educated on spiral ACE bandage wraps instead of circumferential, which is how he arrived and which I corrected today.  Discussed POC as well as importance of ice/elevation.    Stability/Clinical Decision Making Evolving/Moderate complexity    Rehab Potential Good    PT Frequency 2x / week   2-3x/week   PT Duration 12 weeks    PT Next Visit Plan advance ROM, strenfth, funcitonal gait    Consulted and Agree with Plan of Care Patient             Patient will benefit from skilled therapeutic intervention in order to improve the following deficits and impairments:  Abnormal gait, Decreased range of motion, Difficulty walking, Pain, Decreased balance, Decreased scar mobility, Impaired flexibility, Increased edema, Decreased strength, Decreased mobility  Visit Diagnosis: Stiffness of right knee, not elsewhere classified  Difficulty in walking, not elsewhere classified  Acute pain of right knee  Localized edema     Problem List Patient Active Problem List   Diagnosis Date Noted   Malignant neoplasm of prostate ([place]) 10/14/2018   Ann Lions PT, DPT, PN2   Supplemental Physical Therapist [place]    Pager [phone] Acute Rehab Office [address]. [place], [place], [address] Phone: [phone]   Fax:  [phone]  Name: Nicholas Levine MRN: [id number] Date of Birth: 09-02-1945

## 2021-04-14 ENCOUNTER — Encounter: Payer: Self-pay | Admitting: Physical Therapy

## 2021-04-14 ENCOUNTER — Ambulatory Visit: Payer: 59 | Admitting: Physical Therapy

## 2021-04-14 ENCOUNTER — Other Ambulatory Visit: Payer: Self-pay

## 2021-04-14 DIAGNOSIS — M25561 Pain in right knee: Secondary | ICD-10-CM

## 2021-04-14 DIAGNOSIS — R262 Difficulty in walking, not elsewhere classified: Secondary | ICD-10-CM

## 2021-04-14 DIAGNOSIS — M25661 Stiffness of right knee, not elsewhere classified: Secondary | ICD-10-CM

## 2021-04-14 DIAGNOSIS — R600 Localized edema: Secondary | ICD-10-CM

## 2021-04-14 NOTE — Therapy (Signed)
[address]. [place], [place], [address] Phone: [phone]   Fax:  [phone]  Physical Therapy Treatment  Patient Details  Name: Nicholas Levine MRN: [id number] Date of Birth: 11-09-1945 Referring Provider (PT): Aluisio   Encounter Date: 04/14/2021   PT End of Session - 04/14/21 1900     Visit Number 3    Number of Visits 30    Date for PT Re-Evaluation 06/09/21    PT Start Time 1800    PT Stop Time 1900    PT Time Calculation (min) 60 min    Activity Tolerance Patient tolerated treatment well    Behavior During Therapy [facility] for tasks assessed/performed             Past Medical History:  Diagnosis Date   Hypothyroidism    Prostate cancer [facility])     Past Surgical History:  Procedure Laterality Date   PROSTATE BIOPSY     radioactive iodine  1975   to slow thryoid   RADIOACTIVE SEED IMPLANT N/A 02/08/2019   Procedure: RADIOACTIVE SEED IMPLANT/BRACHYTHERAPY IMPLANT;  Surgeon: Cleon Gustin, MD;  Location: [facility];  Service: Urology;  Laterality: N/A;   right knee arthroscopy  1971   exploratory surgery for ligement repair   SPACE OAR INSTILLATION N/A 02/08/2019   Procedure: SPACE OAR INSTILLATION;  Surgeon: Cleon Gustin, MD;  Location: [facility];  Service: Urology;  Laterality: N/A;    There were no vitals filed for this visit.   Subjective Assessment - 04/14/21 1811     Subjective Patient hasd some bleeding from the incision. He went to the Dr who said everything looks good.    Limitations Lifting;Sitting;Standing;Walking;House hold activities    Patient Stated Goals stronger, less pain, walk better    Currently in Pain? Yes    Pain Score 5     Pain Location Knee    Pain Orientation Right    Pain Descriptors / Indicators Aching    Pain Type Surgical pain    Pain Onset In the past 7 days    Pain Frequency Intermittent                [facility]  PT Assessment - 04/14/21 0001       PROM   Right Knee Extension 14    Right Knee Flexion 89                           [facility] Adult PT Treatment/Exercise - 04/14/21 0001       Knee/Hip Exercises: Aerobic   Nustep L5 x 6 minutes.      Knee/Hip Exercises: Seated   Long Arc Quad Strengthening;Right;15 reps    Heel Slides Strengthening;AROM;Right;15 reps      Knee/Hip Exercises: Supine   Straight Leg Raises Strengthening;Right;15 reps      Modalities   Modalities Vasopneumatic      Vasopneumatic   Number Minutes Vasopneumatic  15 minutes    Vasopnuematic Location  Knee    Vasopneumatic Pressure Low    Vasopneumatic Temperature  34      Manual Therapy   Manual Therapy Edema management;Joint mobilization;Passive ROM    Edema Management retrograde massage with LE elevated    Passive ROM extension and flexion as tolerated                     PT Education - 04/14/21 1908  Education Details Appropriate effort during HEP, not holding breath or pushing iwth arms. Positioning of RLE in supine    Person(s) Educated Patient    Methods Explanation;Demonstration    Comprehension Verbalized understanding              PT Short Term Goals - 04/14/21 1814       PT SHORT TERM GOAL #1   Title independent iwth initial HEP    Time 1    Period Weeks    Status On-going    Target Date 04/21/21               PT Long Term Goals - 04/14/21 1910       PT LONG TERM GOAL #2   Title increase AROM to 5-115 degrees flexion    Baseline 14-89 AAROM    Time 11    Period Weeks    Status On-going                    Patient will benefit from skilled therapeutic intervention in order to improve the following deficits and impairments:     Visit Diagnosis: Stiffness of right knee, not elsewhere classified  Difficulty in walking, not elsewhere classified  Acute pain of right knee  Localized edema     Problem List Patient Active  Problem List   Diagnosis Date Noted   Malignant neoplasm of prostate ([place]) 10/14/2018    Marcelina Morel, DPT 04/14/2021, 7:12 PM  [address]. [place], [place], [address] Phone: [phone]   Fax:  [phone]  Name: Zayd Bonet MRN: [id number] Date of Birth: 01/01/1946

## 2021-04-16 ENCOUNTER — Ambulatory Visit: Payer: 59 | Admitting: Physical Therapy

## 2021-04-19 ENCOUNTER — Ambulatory Visit: Payer: 59 | Admitting: Physical Therapy

## 2021-04-20 ENCOUNTER — Encounter: Payer: Self-pay | Admitting: Physical Therapy

## 2021-04-20 ENCOUNTER — Encounter: Payer: 59 | Admitting: Physical Therapy

## 2021-04-20 ENCOUNTER — Other Ambulatory Visit: Payer: Self-pay

## 2021-04-20 ENCOUNTER — Ambulatory Visit: Payer: 59 | Admitting: Physical Therapy

## 2021-04-20 DIAGNOSIS — M25561 Pain in right knee: Secondary | ICD-10-CM | POA: Diagnosis not present

## 2021-04-20 DIAGNOSIS — R262 Difficulty in walking, not elsewhere classified: Secondary | ICD-10-CM

## 2021-04-20 DIAGNOSIS — M25661 Stiffness of right knee, not elsewhere classified: Secondary | ICD-10-CM

## 2021-04-20 DIAGNOSIS — R600 Localized edema: Secondary | ICD-10-CM

## 2021-04-20 NOTE — Therapy (Signed)
[address]. [place], [place], [address] Phone: [phone]   Fax:  [phone]  Physical Therapy Treatment  Patient Details  Name: Cray Monnin MRN: [id number] Date of Birth: 19-Nov-1945 Referring Provider (PT): Aluisio   Encounter Date: 04/20/2021   PT End of Session - 04/20/21 1807     Visit Number 4    Number of Visits 30    Date for PT Re-Evaluation 06/09/21    PT Start Time 6761    PT Stop Time 1805    PT Time Calculation (min) 48 min    Activity Tolerance Patient tolerated treatment well    Behavior During Therapy [facility] for tasks assessed/performed             Past Medical History:  Diagnosis Date   Hypothyroidism    Prostate cancer [facility])     Past Surgical History:  Procedure Laterality Date   PROSTATE BIOPSY     radioactive iodine  1975   to slow thryoid   RADIOACTIVE SEED IMPLANT N/A 02/08/2019   Procedure: RADIOACTIVE SEED IMPLANT/BRACHYTHERAPY IMPLANT;  Surgeon: Cleon Gustin, MD;  Location: [facility];  Service: Urology;  Laterality: N/A;   right knee arthroscopy  1971   exploratory surgery for ligement repair   SPACE OAR INSTILLATION N/A 02/08/2019   Procedure: SPACE OAR INSTILLATION;  Surgeon: Cleon Gustin, MD;  Location: [facility];  Service: Urology;  Laterality: N/A;    There were no vitals filed for this visit.   Subjective Assessment - 04/20/21 1720     Subjective I am swelling some, saw the PA today and everything is good    Currently in Pain? Yes    Pain Score 2     Pain Location Knee    Pain Orientation Right    Pain Descriptors / Indicators Tightness    Aggravating Factors  doing too much                               [facility] Adult PT Treatment/Exercise - 04/20/21 0001       Ambulation/Gait   Gait Comments with SPC, needed a lot of cues for sequencing but then got much better, then only cues to bend the  knee, did 200 feet and then walked to the car with CGA      Knee/Hip Exercises: Aerobic   Nustep L5 x 6 minutes.      Knee/Hip Exercises: Machines for Strengthening   Cybex Knee Extension 5# right only x10,    Cybex Knee Flexion 25# 2x10, 35# x10    Cybex Leg Press 20# 2x10 end range only      Knee/Hip Exercises: Standing   Hip Abduction Both;2 sets;10 reps    Abduction Limitations 2.5#    Hip Extension Both;2 sets;10 reps    Extension Limitations 2.5#      Knee/Hip Exercises: Supine   Short Arc Quad Sets Right;3 sets;10 reps    Short Arc Quad Sets Limitations 2.5#    Other Supine Knee/Hip Exercises feet on ball K2C, small bridges, isometric abs                       PT Short Term Goals - 04/20/21 1810       PT SHORT TERM GOAL #1   Title independent iwth initial HEP    Status Partially Met  PT Long Term Goals - 04/20/21 1810       PT LONG TERM GOAL #1   Title walk with SPC or LRAD x 500 feet    Status On-going      PT LONG TERM GOAL #2   Title increase AROM to 5-115 degrees flexion    Status On-going                   Plan - 04/20/21 1808     Clinical Impression Statement Saw the PA today still feels like everything is going well.  I added gait with SPC, had a lot of difficulty at first but once we did a lot of verbal education he was ble to do without much issue.  He has c/o tightness and tenderness in the right HS area, the PA felt like it was where the tourniquet was and I agrees that this is where this would be, said they could do some light massage.  He needs a lot of cues to go slow and do end ROM.  Overall he is doing great    PT Next Visit Plan advance ROM, strenfth, funcitonal gait    Consulted and Agree with Plan of Care Patient             Patient will benefit from skilled therapeutic intervention in order to improve the following deficits and impairments:  Abnormal gait, Decreased range of motion, Difficulty  walking, Pain, Decreased balance, Decreased scar mobility, Impaired flexibility, Increased edema, Decreased strength, Decreased mobility  Visit Diagnosis: Stiffness of right knee, not elsewhere classified  Difficulty in walking, not elsewhere classified  Acute pain of right knee  Localized edema     Problem List Patient Active Problem List   Diagnosis Date Noted   Malignant neoplasm of prostate ([place]) 10/14/2018    Sumner Boast, PT 04/20/2021, 6:10 PM  [place]. [place], [place], [address] Phone: [phone]   Fax:  [phone]  Name: Antoneo Ghrist MRN: [id number] Date of Birth: 1945/09/08

## 2021-04-21 ENCOUNTER — Ambulatory Visit: Payer: 59 | Admitting: Physical Therapy

## 2021-04-26 ENCOUNTER — Encounter: Payer: Self-pay | Admitting: Physical Therapy

## 2021-04-26 ENCOUNTER — Other Ambulatory Visit: Payer: Self-pay

## 2021-04-26 ENCOUNTER — Ambulatory Visit: Payer: 59 | Admitting: Physical Therapy

## 2021-04-26 DIAGNOSIS — M25561 Pain in right knee: Secondary | ICD-10-CM | POA: Diagnosis not present

## 2021-04-26 DIAGNOSIS — R600 Localized edema: Secondary | ICD-10-CM

## 2021-04-26 DIAGNOSIS — M25661 Stiffness of right knee, not elsewhere classified: Secondary | ICD-10-CM

## 2021-04-26 DIAGNOSIS — R262 Difficulty in walking, not elsewhere classified: Secondary | ICD-10-CM

## 2021-04-26 NOTE — Therapy (Signed)
[place]. [place], [place], [address] Phone: [phone]   Fax:  [phone]  Physical Therapy Treatment  Patient Details  Name: Nicholas Levine MRN: [id number] Date of Birth: December 19, 1945 Referring Provider (PT): Aluisio   Encounter Date: 04/26/2021   PT End of Session - 04/26/21 1643     Visit Number 5    Number of Visits 30    Date for PT Re-Evaluation 06/09/21    PT Start Time 1600    PT Stop Time 1645    PT Time Calculation (min) 45 min    Activity Tolerance Patient tolerated treatment well    Behavior During Therapy [facility] for tasks assessed/performed             Past Medical History:  Diagnosis Date   Hypothyroidism    Prostate cancer [facility])     Past Surgical History:  Procedure Laterality Date   PROSTATE BIOPSY     radioactive iodine  1975   to slow thryoid   RADIOACTIVE SEED IMPLANT N/A 02/08/2019   Procedure: RADIOACTIVE SEED IMPLANT/BRACHYTHERAPY IMPLANT;  Surgeon: Cleon Gustin, MD;  Location: [facility];  Service: Urology;  Laterality: N/A;   right knee arthroscopy  1971   exploratory surgery for ligement repair   SPACE OAR INSTILLATION N/A 02/08/2019   Procedure: SPACE OAR INSTILLATION;  Surgeon: Cleon Gustin, MD;  Location: [facility];  Service: Urology;  Laterality: N/A;    There were no vitals filed for this visit.   Subjective Assessment - 04/26/21 1606     Subjective "Im excellent "    Currently in Pain? No/denies                               [facility] Adult PT Treatment/Exercise - 04/26/21 0001       Knee/Hip Exercises: Aerobic   Nustep L3 x 6 min LE only      Knee/Hip Exercises: Machines for Strengthening   Cybex Knee Extension 5# right only 2x10    Cybex Knee Flexion 35# 2x10, RLE 20lb x10    Cybex Leg Press 30# 2x10, RLE 20lb 2x10      Knee/Hip Exercises: Standing   Forward Step Up Both;1 set;10 reps;Hand  Hold: 0;Step Height: 6"    Other Standing Knee Exercises Standing march 2x10      Knee/Hip Exercises: Seated   Sit to Sand 2 sets;10 reps;without UE support      Manual Therapy   Passive ROM extension and flexion as tolerated                       PT Short Term Goals - 04/20/21 1810       PT SHORT TERM GOAL #1   Title independent iwth initial HEP    Status Partially Met               PT Long Term Goals - 04/20/21 1810       PT LONG TERM GOAL #1   Title walk with SPC or LRAD x 500 feet    Status On-going      PT LONG TERM GOAL #2   Title increase AROM to 5-115 degrees flexion    Status On-going                   Plan - 04/26/21 1645     Clinical Impression  Statement Pt continues to do well overall and progress towards goals. Added more functional interventions today. Some compensation noted with sit to stands and step ups, but pt can correct some with cues. Firm end feel noted with PROM but no reports of increase pain. Decrease TKE with SL extensions. Cues for full ROM needed on leg press.    Stability/Clinical Decision Making Evolving/Moderate complexity    Rehab Potential Good    PT Frequency 2x / week    PT Duration 12 weeks    PT Next Visit Plan advance ROM, strength, functional gait             Patient will benefit from skilled therapeutic intervention in order to improve the following deficits and impairments:  Abnormal gait, Decreased range of motion, Difficulty walking, Pain, Decreased balance, Decreased scar mobility, Impaired flexibility, Increased edema, Decreased strength, Decreased mobility  Visit Diagnosis: Stiffness of right knee, not elsewhere classified  Difficulty in walking, not elsewhere classified  Localized edema  Acute pain of right knee     Problem List Patient Active Problem List   Diagnosis Date Noted   Malignant neoplasm of prostate ([place]) 10/14/2018    Scot Jun, PTA 04/26/2021, 4:49  PM  [address]. [place], [place], [address] Phone: [phone]   Fax:  [phone]  Name: Nicholas Levine MRN: [id number] Date of Birth: [birth year]

## 2021-04-28 ENCOUNTER — Ambulatory Visit: Payer: 59 | Admitting: Physical Therapy

## 2021-04-30 ENCOUNTER — Ambulatory Visit: Payer: 59 | Attending: Orthopedic Surgery | Admitting: Physical Therapy

## 2021-04-30 ENCOUNTER — Encounter: Payer: 59 | Admitting: Physical Therapy

## 2021-04-30 ENCOUNTER — Other Ambulatory Visit: Payer: Self-pay

## 2021-04-30 DIAGNOSIS — R600 Localized edema: Secondary | ICD-10-CM | POA: Diagnosis present

## 2021-04-30 DIAGNOSIS — R262 Difficulty in walking, not elsewhere classified: Secondary | ICD-10-CM | POA: Diagnosis present

## 2021-04-30 DIAGNOSIS — M25561 Pain in right knee: Secondary | ICD-10-CM | POA: Insufficient documentation

## 2021-04-30 DIAGNOSIS — M25661 Stiffness of right knee, not elsewhere classified: Secondary | ICD-10-CM | POA: Diagnosis not present

## 2021-04-30 NOTE — Therapy (Signed)
[place]. [place], [place], [address] Phone: [phone]   Fax:  [phone]  Physical Therapy Treatment  Patient Details  Name: Nicholas Levine MRN: [id number] Date of Birth: April 25, 1946 Referring Provider (PT): Aluisio   Encounter Date: 04/30/2021   PT End of Session - 04/30/21 0938     Visit Number 6    Number of Visits 30    Date for PT Re-Evaluation 06/09/21    PT Start Time 0850    PT Stop Time 0930    PT Time Calculation (min) 40 min             Past Medical History:  Diagnosis Date   Hypothyroidism    Prostate cancer [facility])     Past Surgical History:  Procedure Laterality Date   PROSTATE BIOPSY     radioactive iodine  1975   to slow thryoid   RADIOACTIVE SEED IMPLANT N/A 02/08/2019   Procedure: RADIOACTIVE SEED IMPLANT/BRACHYTHERAPY IMPLANT;  Surgeon: Cleon Gustin, MD;  Location: [facility];  Service: Urology;  Laterality: N/A;   right knee arthroscopy  1971   exploratory surgery for ligement repair   SPACE OAR INSTILLATION N/A 02/08/2019   Procedure: SPACE OAR INSTILLATION;  Surgeon: Cleon Gustin, MD;  Location: [facility];  Service: Urology;  Laterality: N/A;    There were no vitals filed for this visit.   Subjective Assessment - 04/30/21 0856     Subjective doing great. I don't think I need walker    Currently in Pain? No/denies                [facility] PT Assessment - 04/30/21 0001       AROM   AROM Assessment Site Knee    Right/Left Knee Right    Right Knee Extension 7    Right Knee Flexion 100                           [facility] Adult PT Treatment/Exercise - 04/30/21 0001       Ambulation/Gait   Gait Comments practiced amb without AD and did well, okay to D/C in home   practiced stairs with step too without rail and step over step with rail     Knee/Hip Exercises: Aerobic   Nustep L 5 5 min      Knee/Hip  Exercises: Machines for Strengthening   Cybex Leg Press Rt LE 2 sets 10 focus on TKE   calf raises 30# 2 sets 15     Knee/Hip Exercises: Seated   Long Arc Quad Strengthening;Right;2 sets;10 reps;Weights    Long Arc Quad Weight 3 lbs.    Long Arc Quad Limitations 3 sec TKE    Other Seated Knee/Hip Exercises TKE blue tband 2 sets 10    Hamstring Curl Strengthening;Right;2 sets;10 reps   blue tband     Manual Therapy   Manual Therapy Soft tissue mobilization;Passive ROM    Soft tissue mobilization for ROM and edema    Passive ROM extension and flexion                       PT Short Term Goals - 04/30/21 2633       PT SHORT TERM GOAL #1   Title independent iwth initial HEP    Status Achieved  PT Long Term Goals - 04/20/21 1810       PT LONG TERM GOAL #1   Title walk with SPC or LRAD x 500 feet    Status On-going      PT LONG TERM GOAL #2   Title increase AROM to 5-115 degrees flexion    Status On-going                   Plan - 04/30/21 4010     Clinical Impression Statement STG met. Pt doing very well,ROM increasing. Progressed off walker. Fous on SLOW and Controlled ROM with emphasis on TKE and quad activation. educ on STM with lotion and CFM for scar    PT Next Visit Plan progress functional ROM and strength             Patient will benefit from skilled therapeutic intervention in order to improve the following deficits and impairments:  Abnormal gait, Decreased range of motion, Difficulty walking, Pain, Decreased balance, Decreased scar mobility, Impaired flexibility, Increased edema, Decreased strength, Decreased mobility  Visit Diagnosis: Stiffness of right knee, not elsewhere classified  Difficulty in walking, not elsewhere classified  Localized edema     Problem List Patient Active Problem List   Diagnosis Date Noted   Malignant neoplasm of prostate ([place]) 10/14/2018    Kriston Mckinnie,ANGIE, PTA 04/30/2021, 9:40  AM  [address]. [place], [place], [address] Phone: [phone]   Fax:  [phone]  Name: Nicholas Levine MRN: [id number] Date of Birth: 12/02/1945

## 2021-05-03 ENCOUNTER — Encounter: Payer: Self-pay | Admitting: Physical Therapy

## 2021-05-03 ENCOUNTER — Other Ambulatory Visit: Payer: Self-pay

## 2021-05-03 ENCOUNTER — Ambulatory Visit: Payer: 59 | Admitting: Physical Therapy

## 2021-05-03 DIAGNOSIS — M25661 Stiffness of right knee, not elsewhere classified: Secondary | ICD-10-CM

## 2021-05-03 DIAGNOSIS — R262 Difficulty in walking, not elsewhere classified: Secondary | ICD-10-CM

## 2021-05-03 DIAGNOSIS — R600 Localized edema: Secondary | ICD-10-CM

## 2021-05-03 DIAGNOSIS — M25561 Pain in right knee: Secondary | ICD-10-CM

## 2021-05-03 NOTE — Therapy (Signed)
[address]. [place], [place], [address] Phone: [phone]   Fax:  [phone]  Physical Therapy Treatment  Patient Details  Name: Nicholas Levine MRN: [id number] Date of Birth: [birth year] Referring Provider (PT): Aluisio   Encounter Date: 05/03/2021   PT End of Session - 05/03/21 1700     Visit Number 7    Number of Visits 30    Date for PT Re-Evaluation 06/09/21    PT Start Time 4010    PT Stop Time 1700    PT Time Calculation (min) 46 min    Activity Tolerance Patient tolerated treatment well    Behavior During Therapy [facility] for tasks assessed/performed             Past Medical History:  Diagnosis Date   Hypothyroidism    Prostate cancer [facility])     Past Surgical History:  Procedure Laterality Date   PROSTATE BIOPSY     radioactive iodine  1975   to slow thryoid   RADIOACTIVE SEED IMPLANT N/A 02/08/2019   Procedure: RADIOACTIVE SEED IMPLANT/BRACHYTHERAPY IMPLANT;  Surgeon: Cleon Gustin, MD;  Location[facility];  Service: Urology;  Laterality: N/A;   right knee arthroscopy  1971   exploratory surgery for ligement repair   SPACE OAR INSTILLATION N/A 02/08/2019   Procedure: SPACE OAR INSTILLATION;  Surgeon: Cleon Gustin, MD;  Location: [facility];  Service: Urology;  Laterality: N/A;    There were no vitals filed for this visit.   Subjective Assessment - 05/03/21 1620     Subjective I am doing well, getting better and better, still sore and tight at times    Currently in Pain? Yes    Pain Score 2     Pain Location Knee    Pain Orientation Right    Pain Descriptors / Indicators Tightness    Aggravating Factors  at night, first few steps                               [facility] Adult PT Treatment/Exercise - 05/03/21 0001       Ambulation/Gait   Gait Comments gait around the back building no device, good pace, some difficulty going  down the hill      High Level Balance   High Level Balance Comments on airex ball toss, on airex eyes closed and then with head turns, side stepping over bolster onto and off of airex, tried standing on the bosu, this was very difficult for him      Knee/Hip Exercises: Aerobic   Elliptical LEvel 3 x 3 minutes    Nustep L 6 6 min LE's only      Knee/Hip Exercises: Machines for Strengthening   Cybex Leg Press 40# 2x10, right leg only smaller ROM 40# 2x10 with green tband to help with TKE      Knee/Hip Exercises: Standing   Walking with Sports Cord all directions 50#                       PT Short Term Goals - 04/30/21 0938       PT SHORT TERM GOAL #1   Title independent iwth initial HEP    Status Achieved               PT Long Term Goals - 05/03/21 1704       PT  LONG TERM GOAL #1   Title walk with SPC or LRAD x 500 feet    Status Achieved                   Plan - 05/03/21 1700     Clinical Impression Statement I added elliptical, resisted gait and proprioception today, he has diffiuclty with the proprioception.  He had some difficulty walking down the slope outside.  So we added the balance    PT Next Visit Plan see how he did    Consulted and Agree with Plan of Care Patient             Patient will benefit from skilled therapeutic intervention in order to improve the following deficits and impairments:  Abnormal gait, Decreased range of motion, Difficulty walking, Pain, Decreased balance, Decreased scar mobility, Impaired flexibility, Increased edema, Decreased strength, Decreased mobility  Visit Diagnosis: Stiffness of right knee, not elsewhere classified  Difficulty in walking, not elsewhere classified  Localized edema  Acute pain of right knee     Problem List Patient Active Problem List   Diagnosis Date Noted   Malignant neoplasm of prostate ([place]) 10/14/2018    Sumner Boast, PT 05/03/2021, 5:05 PM  [place]. [place], [place], [address] Phone: [phone]   Fax:  [phone]  Name: Nicholas Levine MRN: [id number] Date of Birth: 1946-02-07

## 2021-05-05 ENCOUNTER — Encounter: Payer: Self-pay | Admitting: Physical Therapy

## 2021-05-05 ENCOUNTER — Ambulatory Visit: Payer: 59 | Admitting: Physical Therapy

## 2021-05-05 ENCOUNTER — Other Ambulatory Visit: Payer: Self-pay

## 2021-05-05 DIAGNOSIS — R262 Difficulty in walking, not elsewhere classified: Secondary | ICD-10-CM

## 2021-05-05 DIAGNOSIS — M25661 Stiffness of right knee, not elsewhere classified: Secondary | ICD-10-CM

## 2021-05-05 DIAGNOSIS — R600 Localized edema: Secondary | ICD-10-CM

## 2021-05-05 DIAGNOSIS — M25561 Pain in right knee: Secondary | ICD-10-CM

## 2021-05-05 NOTE — Therapy (Signed)
[place]. [place], [place], [address] Phone: [phone]   Fax:  [phone]  Physical Therapy Treatment  Patient Details  Name: Nicholas Levine MRN: [id number] Date of Birth: [birth year] Referring Provider (PT): Aluisio   Encounter Date: 05/05/2021   PT End of Session - 05/05/21 1655     Visit Number 8    Number of Visits 30    Date for PT Re-Evaluation 06/09/21    PT Start Time 4818    PT Stop Time 1657    PT Time Calculation (min) 43 min    Activity Tolerance Patient tolerated treatment well    Behavior During Therapy [facility] for tasks assessed/performed             Past Medical History:  Diagnosis Date   Hypothyroidism    Prostate cancer [facility])     Past Surgical History:  Procedure Laterality Date   PROSTATE BIOPSY     radioactive iodine  1975   to slow thryoid   RADIOACTIVE SEED IMPLANT N/A 02/08/2019   Procedure: RADIOACTIVE SEED IMPLANT/BRACHYTHERAPY IMPLANT;  Surgeon: Cleon Gustin, MD;  Location: [facility];  Service: Urology;  Laterality: N/A;   right knee arthroscopy  1971   exploratory surgery for ligement repair   SPACE OAR INSTILLATION N/A 02/08/2019   Procedure: SPACE OAR INSTILLATION;  Surgeon: Cleon Gustin, MD;  Location: [facility];  Service: Urology;  Laterality: N/A;    There were no vitals filed for this visit.   Subjective Assessment - 05/05/21 1620     Subjective Patient reports that his right hip is hurting, he wonders if we overdid it, but he also does some compensation with the hip during walking    Currently in Pain? Yes    Pain Score 3     Pain Location Knee   and hip   Pain Descriptors / Indicators Sore                [facility] PT Assessment - 05/05/21 0001       AROM   Right Knee Extension 10    Right Knee Flexion 106                           [facility] Adult PT Treatment/Exercise - 05/05/21 0001        Ambulation/Gait   Gait Comments gait without device, working on no trunk lean      Knee/Hip Exercises: Stretches   Passive Hamstring Stretch Right;3 reps;20 seconds    Quad Stretch Right;3 reps;20 seconds    ITB Stretch Right;3 reps;20 seconds    Piriformis Stretch 3 reps;20 seconds      Knee/Hip Exercises: Aerobic   Elliptical LEvel 3 x 3 minutes    Recumbent Bike level 3 x 5 minutes, some zstiffness in the right hip      Knee/Hip Exercises: Machines for Strengthening   Cybex Knee Extension 10# 2x10 cues for TKE, right only 5# 3x 5    Cybex Knee Flexion 35# 3x10    Cybex Leg Press 40# 2x10, right leg only smaller ROM 40# 2x10 with green tband to help with TKE                       PT Short Term Goals - 04/30/21 5631       PT SHORT TERM GOAL #1   Title independent iwth initial HEP  Status Achieved               PT Long Term Goals - 05/03/21 1704       PT LONG TERM GOAL #1   Title walk with SPC or LRAD x 500 feet    Status Achieved                   Plan - 05/05/21 1656     Clinical Impression Statement Patient with some increase in right hip tightness and soreness, I spent a little more time on stretching, tight ITB and quad and piriformis.  ROM is much better, he does have a slight trendelenberg on the right with walking, worked on stopping this today    PT Next Visit Plan see how the hip is doing    Consulted and Agree with Plan of Care Patient             Patient will benefit from skilled therapeutic intervention in order to improve the following deficits and impairments:  Abnormal gait, Decreased range of motion, Difficulty walking, Pain, Decreased balance, Decreased scar mobility, Impaired flexibility, Increased edema, Decreased strength, Decreased mobility  Visit Diagnosis: Stiffness of right knee, not elsewhere classified  Difficulty in walking, not elsewhere classified  Localized edema  Acute pain of right  knee     Problem List Patient Active Problem List   Diagnosis Date Noted   Malignant neoplasm of prostate ([place]) 10/14/2018    Sumner Boast, PT 05/05/2021, 4:57 PM  [place]. [place], [place], [address] Phone: [phone]   Fax:  [phone]  Name: Nicholas Levine MRN: [id number] Date of Birth: [birth year]

## 2021-05-07 ENCOUNTER — Encounter: Payer: 59 | Admitting: Physical Therapy

## 2021-05-10 ENCOUNTER — Encounter: Payer: Self-pay | Admitting: Physical Therapy

## 2021-05-10 ENCOUNTER — Other Ambulatory Visit: Payer: Self-pay

## 2021-05-10 ENCOUNTER — Ambulatory Visit: Payer: 59 | Admitting: Physical Therapy

## 2021-05-10 DIAGNOSIS — M25661 Stiffness of right knee, not elsewhere classified: Secondary | ICD-10-CM

## 2021-05-10 DIAGNOSIS — R600 Localized edema: Secondary | ICD-10-CM

## 2021-05-10 DIAGNOSIS — R262 Difficulty in walking, not elsewhere classified: Secondary | ICD-10-CM

## 2021-05-10 DIAGNOSIS — M25561 Pain in right knee: Secondary | ICD-10-CM

## 2021-05-10 NOTE — Therapy (Signed)
[place]. [place], [place], [address] Phone: [phone]   Fax:  [phone]  Physical Therapy Treatment  Patient Details  Name: Nicholas Levine MRN: [id number] Date of Birth: 02/10/1946 Referring Provider (PT): Aluisio   Encounter Date: 05/10/2021   PT End of Session - 05/10/21 1657     Visit Number 9    Number of Visits 30    Date for PT Re-Evaluation 06/09/21    PT Start Time 4665    PT Stop Time 1657    PT Time Calculation (min) 43 min    Activity Tolerance Patient tolerated treatment well    Behavior During Therapy [facility] for tasks assessed/performed             Past Medical History:  Diagnosis Date   Hypothyroidism    Prostate cancer [facility])     Past Surgical History:  Procedure Laterality Date   PROSTATE BIOPSY     radioactive iodine  1975   to slow thryoid   RADIOACTIVE SEED IMPLANT N/A 02/08/2019   Procedure: RADIOACTIVE SEED IMPLANT/BRACHYTHERAPY IMPLANT;  Surgeon: Cleon Gustin, MD;  Location: [facility];  Service: Urology;  Laterality: N/A;   right knee arthroscopy  1971   exploratory surgery for ligement repair   SPACE OAR INSTILLATION N/A 02/08/2019   Procedure: SPACE OAR INSTILLATION;  Surgeon: Cleon Gustin, MD;  Location: [facility];  Service: Urology;  Laterality: N/A;    There were no vitals filed for this visit.   Subjective Assessment - 05/10/21 1616     Subjective REally not using the walker at all, reports some hip pain, right anterior    Currently in Pain? Yes    Pain Score 2     Pain Location Hip    Pain Orientation Right;Anterior    Aggravating Factors  pain iwth reaching                [facility] PT Assessment - 05/10/21 0001       AROM   Right Knee Extension 11    Right Knee Flexion 110      PROM   Right Knee Flexion 117                           [facility] Adult PT Treatment/Exercise - 05/10/21 0001        High Level Balance   High Level Balance Comments on airex ball toss, on airex eyes closed and then with head turns, side stepping over bolster onto and off of airex, tried standing on the bosu, this was very difficult for him      Knee/Hip Exercises: Stretches   Passive Hamstring Stretch Right;3 reps;20 seconds      Knee/Hip Exercises: Aerobic   Elliptical LEvel 3 x 4 minutes      Knee/Hip Exercises: Machines for Strengthening   Cybex Knee Extension 5# 3x10 right only    Cybex Knee Flexion 25# right only 3x10    Cybex Leg Press 40# 2x10, right leg only smaller ROM 40# 2x10 with green tband to help with TKE      Knee/Hip Exercises: Standing   Walking with Sports Cord all directions outside with black tband    Other Standing Knee Exercises 20# farmer carry, and then 10# overhead carry single arms    Other Standing Knee Exercises '      Knee/Hip Exercises: Supine   Other Supine Knee/Hip  Exercises feet on ball K2C, small bridges, isometric abs      Manual Therapy   Passive ROM extension and flexion                       PT Short Term Goals - 04/30/21 0938       PT SHORT TERM GOAL #1   Title independent iwth initial HEP    Status Achieved               PT Long Term Goals - 05/10/21 1658       PT LONG TERM GOAL #1   Title walk with SPC or LRAD x 500 feet    Status Achieved      PT LONG TERM GOAL #2   Title increase AROM to 5-115 degrees flexion    Status Partially Met                   Plan - 05/10/21 1657     Clinical Impression Statement Patient doing well, reports he did some stretches and the hip was feeling better, I added more strength and tried to isolate single leg today, did well, did have fatigue, the ROM is going great as is his gait    PT Next Visit Plan keep progressing as tolerated    Consulted and Agree with Plan of Care Patient             Patient will benefit from skilled therapeutic intervention in order to  improve the following deficits and impairments:  Abnormal gait, Decreased range of motion, Difficulty walking, Pain, Decreased balance, Decreased scar mobility, Impaired flexibility, Increased edema, Decreased strength, Decreased mobility  Visit Diagnosis: Stiffness of right knee, not elsewhere classified  Difficulty in walking, not elsewhere classified  Localized edema  Acute pain of right knee     Problem List Patient Active Problem List   Diagnosis Date Noted   Malignant neoplasm of prostate ([place]) 10/14/2018    Sumner Boast, PT 05/10/2021, 4:59 PM  [place]. [place], [place], [address] Phone: [phone]   Fax:  [phone]  Name: Nicholas Levine MRN: [id number] Date of Birth: Oct 05, 1945

## 2021-05-12 ENCOUNTER — Ambulatory Visit: Payer: 59 | Admitting: Physical Therapy

## 2021-05-12 ENCOUNTER — Encounter: Payer: Self-pay | Admitting: Physical Therapy

## 2021-05-12 ENCOUNTER — Other Ambulatory Visit: Payer: Self-pay

## 2021-05-12 DIAGNOSIS — M25561 Pain in right knee: Secondary | ICD-10-CM

## 2021-05-12 DIAGNOSIS — R262 Difficulty in walking, not elsewhere classified: Secondary | ICD-10-CM

## 2021-05-12 DIAGNOSIS — R600 Localized edema: Secondary | ICD-10-CM

## 2021-05-12 DIAGNOSIS — M25661 Stiffness of right knee, not elsewhere classified: Secondary | ICD-10-CM

## 2021-05-12 NOTE — Therapy (Signed)
[address]. [place], [place], [address] Phone: [phone]   Fax:  [phone] Progress Note Reporting Period 04/09/21 to 05/12/21  See note below for Objective Data and Assessment of Progress/Goals.     Physical Therapy Treatment  Patient Details  Name: Tori Cupps MRN: [id number] Date of Birth: [birth year] Referring Provider (PT): Aluisio   Encounter Date: 05/12/2021   PT End of Session - 05/12/21 1653     Visit Number 10    Number of Visits 30    Date for PT Re-Evaluation 06/09/21    PT Start Time 4680    PT Stop Time 1700    PT Time Calculation (min) 46 min    Activity Tolerance Patient tolerated treatment well    Behavior During Therapy [facility] for tasks assessed/performed             Past Medical History:  Diagnosis Date   Hypothyroidism    Prostate cancer [facility])     Past Surgical History:  Procedure Laterality Date   PROSTATE BIOPSY     radioactive iodine  1975   to slow thryoid   RADIOACTIVE SEED IMPLANT N/A 02/08/2019   Procedure: RADIOACTIVE SEED IMPLANT/BRACHYTHERAPY IMPLANT;  Surgeon: Cleon Gustin, MD;  Location: [facility];  Service: Urology;  Laterality: N/A;   right knee arthroscopy  1971   exploratory surgery for ligement repair   SPACE OAR INSTILLATION N/A 02/08/2019   Procedure: SPACE OAR INSTILLATION;  Surgeon: Cleon Gustin, MD;  Location[facility];  Service: Urology;  Laterality: N/A;    There were no vitals filed for this visit.   Subjective Assessment - 05/12/21 1618     Subjective I was pretty sore after the last time in the knee    Currently in Pain? Yes    Pain Score 3     Pain Location Knee    Pain Orientation Right    Pain Descriptors / Indicators Sore                [facility] PT Assessment - 05/12/21 0001       AROM   Right Knee Flexion 115   after stretches     PROM   Right Knee Flexion 118                            [facility] Adult PT Treatment/Exercise - 05/12/21 0001       Ambulation/Gait   Gait Comments ressisted gait all directions, stairs step over step up and down      Knee/Hip Exercises: Aerobic   Elliptical LEvel 4 x 5 minutes    Recumbent Bike level 4 x 5 minutes      Knee/Hip Exercises: Machines for Strengthening   Cybex Knee Extension 10# 3x10 right only    Cybex Knee Flexion 25# right only 3x10    Cybex Leg Press 40# 2x10, right leg only smaller ROM 40# 2x10 with green tband to help with TKE, no wirght working on deep flexion      Knee/Hip Exercises: Seated   Sit to [employer] 10 reps;without UE support      Manual Therapy   Passive ROM extension and flexion                       PT Short Term Goals - 04/30/21 0938       PT SHORT TERM GOAL #  1   Title independent iwth initial HEP    Status Achieved               PT Long Term Goals - 05/12/21 1655       PT LONG TERM GOAL #1   Title walk with SPC or LRAD x 500 feet    Status Achieved      PT LONG TERM GOAL #2   Title increase AROM to 5-115 degrees flexion    Status Partially Met      PT LONG TERM GOAL #3   Title decrease TUG time to 13 seconds    Status Partially Met      PT LONG TERM GOAL #4   Title go up and down stairs step over step    Status Partially Met                   Plan - 05/12/21 1653     Clinical Impression Statement Patient doing great, no longer using any assistive device, mild antalgic gait, slight decreased extension, Flexion is great.  Still a little swelling, started stairs step over step today without much difficulty.  He has had a little right hip pain and discomfort with strewtching and reaching with the right hand, but this seems to be doing better.  Still some swelling    PT Next Visit Plan keep progressing as tolerated    Consulted and Agree with Plan of Care Patient             Patient will benefit from skilled therapeutic  intervention in order to improve the following deficits and impairments:  Abnormal gait, Decreased range of motion, Difficulty walking, Pain, Decreased balance, Decreased scar mobility, Impaired flexibility, Increased edema, Decreased strength, Decreased mobility  Visit Diagnosis: Stiffness of right knee, not elsewhere classified  Difficulty in walking, not elsewhere classified  Localized edema  Acute pain of right knee     Problem List Patient Active Problem List   Diagnosis Date Noted   Malignant neoplasm of prostate ([place]) 10/14/2018    Sumner Boast, PT 05/12/2021, 4:56 PM  [place]. [place], [place], [address] Phone: [phone]   Fax:  [phone]  Name: Howie Rufus MRN: [id number] Date of Birth: [birth year]

## 2021-05-14 ENCOUNTER — Encounter: Payer: 59 | Admitting: Physical Therapy

## 2021-05-17 ENCOUNTER — Encounter: Payer: Self-pay | Admitting: Physical Therapy

## 2021-05-17 ENCOUNTER — Ambulatory Visit: Payer: 59 | Admitting: Physical Therapy

## 2021-05-17 ENCOUNTER — Other Ambulatory Visit: Payer: Self-pay

## 2021-05-17 DIAGNOSIS — M25661 Stiffness of right knee, not elsewhere classified: Secondary | ICD-10-CM | POA: Diagnosis not present

## 2021-05-17 DIAGNOSIS — R600 Localized edema: Secondary | ICD-10-CM

## 2021-05-17 DIAGNOSIS — R262 Difficulty in walking, not elsewhere classified: Secondary | ICD-10-CM

## 2021-05-17 DIAGNOSIS — M25561 Pain in right knee: Secondary | ICD-10-CM

## 2021-05-17 NOTE — Therapy (Signed)
[address]. [place], [place], [address] Phone: [phone]   Fax:  [phone]  Physical Therapy Treatment  Patient Details  Name: Nicholas Levine MRN: [id number] Date of Birth: Aug 18, 1945 Referring Provider (PT): Aluisio   Encounter Date: 05/17/2021   PT End of Session - 05/17/21 1707     Visit Number 11    Date for PT Re-Evaluation 06/09/21    PT Start Time 0263    PT Stop Time 1700    PT Time Calculation (min) 44 min    Activity Tolerance Patient tolerated treatment well    Behavior During Therapy [facility] for tasks assessed/performed             Past Medical History:  Diagnosis Date   Hypothyroidism    Prostate cancer [facility])     Past Surgical History:  Procedure Laterality Date   PROSTATE BIOPSY     radioactive iodine  1975   to slow thryoid   RADIOACTIVE SEED IMPLANT N/A 02/08/2019   Procedure: RADIOACTIVE SEED IMPLANT/BRACHYTHERAPY IMPLANT;  Surgeon: Cleon Gustin, MD;  Location: [facility];  Service: Urology;  Laterality: N/A;   right knee arthroscopy  1971   exploratory surgery for ligement repair   SPACE OAR INSTILLATION N/A 02/08/2019   Procedure: SPACE OAR INSTILLATION;  Surgeon: Cleon Gustin, MD;  Location: [facility];  Service: Urology;  Laterality: N/A;    There were no vitals filed for this visit.   Subjective Assessment - 05/17/21 1619     Subjective I do get sore, not sleeping as well lately.  MD was very pleased    Currently in Pain? No/denies                               [facility] Adult PT Treatment/Exercise - 05/17/21 0001       High Level Balance   High Level Balance Comments bosu standing trying to get to 10 seconds      Knee/Hip Exercises: Aerobic   Elliptical LEvel 4 x 5 minutes    Recumbent Bike level 4 x 6 minutes      Knee/Hip Exercises: Machines for Strengthening   Cybex Knee Extension 10# 3x10 right  only    Cybex Knee Flexion 25# right only 3x10    Cybex Leg Press 40# 2x10, right leg only smaller ROM 40# 2x10 with green tband to help with TKE, no wirght working on deep flexion      Knee/Hip Exercises: Standing   Terminal Knee Extension Right;20 reps    Terminal Knee Extension Limitations ball behind knee      Knee/Hip Exercises: Supine   Short Arc Quad Sets Right;3 sets;10 reps    Short Arc Quad Sets Limitations 5#                       PT Short Term Goals - 04/30/21 0938       PT SHORT TERM GOAL #1   Title independent iwth initial HEP    Status Achieved               PT Long Term Goals - 05/12/21 1655       PT LONG TERM GOAL #1   Title walk with SPC or LRAD x 500 feet    Status Achieved      PT LONG TERM GOAL #2   Title increase AROM  to 5-115 degrees flexion    Status Partially Met      PT LONG TERM GOAL #3   Title decrease TUG time to 13 seconds    Status Partially Met      PT LONG TERM GOAL #4   Title go up and down stairs step over step    Status Partially Met                   Plan - 05/17/21 1707     Clinical Impression Statement MD was pleased, he is still having some quad lag, passively I can get him to full extension but he is lacking about 8 degrees actively.  I asked him to focus on the SAQ and TKE, he needed a lot of cues to get quads and not use the hip flexor, still with difficulty on the bosu    PT Next Visit Plan keep progressing as tolerated    Consulted and Agree with Plan of Care Patient             Patient will benefit from skilled therapeutic intervention in order to improve the following deficits and impairments:  Abnormal gait, Decreased range of motion, Difficulty walking, Pain, Decreased balance, Decreased scar mobility, Impaired flexibility, Increased edema, Decreased strength, Decreased mobility  Visit Diagnosis: Stiffness of right knee, not elsewhere classified  Difficulty in walking, not  elsewhere classified  Localized edema  Acute pain of right knee     Problem List Patient Active Problem List   Diagnosis Date Noted   Malignant neoplasm of prostate ([place]) 10/14/2018    Sumner Boast, PT 05/17/2021, 5:09 PM  [address]. [place], [place], [address] Phone: [phone]   Fax:  [phone]  Name: Nicholas Levine MRN: [id number] Date of Birth: February 21, 1946

## 2021-05-19 ENCOUNTER — Ambulatory Visit: Payer: 59 | Admitting: Physical Therapy

## 2021-05-19 ENCOUNTER — Encounter: Payer: Self-pay | Admitting: Physical Therapy

## 2021-05-19 ENCOUNTER — Other Ambulatory Visit: Payer: Self-pay

## 2021-05-19 DIAGNOSIS — R262 Difficulty in walking, not elsewhere classified: Secondary | ICD-10-CM

## 2021-05-19 DIAGNOSIS — M25661 Stiffness of right knee, not elsewhere classified: Secondary | ICD-10-CM | POA: Diagnosis not present

## 2021-05-19 DIAGNOSIS — M25561 Pain in right knee: Secondary | ICD-10-CM

## 2021-05-19 NOTE — Therapy (Signed)
[address]. [place], [place], [address] Phone: [phone]   Fax:  [phone]  Physical Therapy Treatment  Patient Details  Name: Nicholas Levine MRN: [id number] Date of Birth: 25-May-1946 Referring Provider (PT): Aluisio   Encounter Date: 05/19/2021   PT End of Session - 05/19/21 1702     Visit Number 12    Date for PT Re-Evaluation 06/09/21    PT Start Time 1761    PT Stop Time 1702    PT Time Calculation (min) 47 min    Activity Tolerance Patient tolerated treatment well    Behavior During Therapy [facility] for tasks assessed/performed             Past Medical History:  Diagnosis Date   Hypothyroidism    Prostate cancer [facility])     Past Surgical History:  Procedure Laterality Date   PROSTATE BIOPSY     radioactive iodine  1975   to slow thryoid   RADIOACTIVE SEED IMPLANT N/A 02/08/2019   Procedure: RADIOACTIVE SEED IMPLANT/BRACHYTHERAPY IMPLANT;  Surgeon: Cleon Gustin, MD;  Location: [facility];  Service: Urology;  Laterality: N/A;   right knee arthroscopy  1971   exploratory surgery for ligement repair   SPACE OAR INSTILLATION N/A 02/08/2019   Procedure: SPACE OAR INSTILLATION;  Surgeon: Cleon Gustin, MD;  [facility];  Service: Urology;  Laterality: N/A;    There were no vitals filed for this visit.   Subjective Assessment - 05/19/21 1620     Subjective I drove to [place], did okay, got a little stiff    Currently in Pain? No/denies                               [facility] Adult PT Treatment/Exercise - 05/19/21 0001       Knee/Hip Exercises: Stretches   Gastroc Stretch Both;3 reps;20 seconds      Knee/Hip Exercises: Aerobic   Elliptical LEvel 4 x 6 minutes    Recumbent Bike level 5 x 6 minutes      Knee/Hip Exercises: Machines for Strengthening   Cybex Knee Extension 20# both 3x10    Cybex Knee Flexion 55# both 3x10     Other Machine seated row 35# 3x10, lats 35# 3x10, chest press 20# 3x10      Knee/Hip Exercises: Standing   Walking with Sports Cord all directions 70#    Other Standing Knee Exercises 20# farmer carry, and then 10# overhead carry single arms      Knee/Hip Exercises: Prone   Other Prone Exercises planks 20 seconds 2x20 seconds                       PT Short Term Goals - 04/30/21 6073       PT SHORT TERM GOAL #1   Title independent iwth initial HEP    Status Achieved               PT Long Term Goals - 05/19/21 1708       PT LONG TERM GOAL #1   Title walk with SPC or LRAD x 500 feet    Status Achieved      PT LONG TERM GOAL #2   Title increase AROM to 5-115 degrees flexion    Status Partially Met      PT LONG TERM GOAL #3   Title decrease  TUG time to 13 seconds    Status Achieved      PT LONG TERM GOAL #4   Title go up and down stairs step over step    Status Achieved                   Plan - 05/19/21 1707     Clinical Impression Statement Patient talking about going to the gym when we d/c, started the education on machines and weight and reps, how to avoid pain, mechanics, sets and reps.  Next week we will continue this to assure his safe transsition    PT Next Visit Plan work on safe transition to the gym    Consulted and Agree with Plan of Care Patient             Patient will benefit from skilled therapeutic intervention in order to improve the following deficits and impairments:  Abnormal gait, Decreased range of motion, Difficulty walking, Pain, Decreased balance, Decreased scar mobility, Impaired flexibility, Increased edema, Decreased strength, Decreased mobility  Visit Diagnosis: Stiffness of right knee, not elsewhere classified  Difficulty in walking, not elsewhere classified  Acute pain of right knee     Problem List Patient Active Problem List   Diagnosis Date Noted   Malignant neoplasm of prostate ([place])  10/14/2018    Sumner Boast, PT 05/19/2021, 5:09 PM  [place]. [place], [place], [address] Phone: [phone]   Fax:  [phone]  Name: Nicholas Levine MRN: [id number] Date of Birth: February 20, 1946

## 2021-05-26 ENCOUNTER — Other Ambulatory Visit: Payer: Self-pay

## 2021-05-26 ENCOUNTER — Encounter: Payer: Self-pay | Admitting: Physical Therapy

## 2021-05-26 ENCOUNTER — Ambulatory Visit: Payer: 59 | Admitting: Physical Therapy

## 2021-05-26 DIAGNOSIS — M25661 Stiffness of right knee, not elsewhere classified: Secondary | ICD-10-CM | POA: Diagnosis not present

## 2021-05-26 DIAGNOSIS — M25561 Pain in right knee: Secondary | ICD-10-CM

## 2021-05-26 DIAGNOSIS — R600 Localized edema: Secondary | ICD-10-CM

## 2021-05-26 DIAGNOSIS — R262 Difficulty in walking, not elsewhere classified: Secondary | ICD-10-CM

## 2021-05-26 NOTE — Therapy (Signed)
[address]. [place], [place], [address] Phone: [phone]   Fax:  [phone]  Physical Therapy Treatment  Patient Details  Name: Nicholas Levine MRN: [id number] Date of Birth: [birth year] Referring Provider (PT): Aluisio   Encounter Date: 05/26/2021   PT End of Session - 05/26/21 1717     Visit Number 13    Date for PT Re-Evaluation 06/09/21    PT Start Time 1638    PT Stop Time 1718    PT Time Calculation (min) 40 min    Activity Tolerance Patient tolerated treatment well    Behavior During Therapy [facility] for tasks assessed/performed             Past Medical History:  Diagnosis Date   Hypothyroidism    Prostate cancer [facility])     Past Surgical History:  Procedure Laterality Date   PROSTATE BIOPSY     radioactive iodine  1975   to slow thryoid   RADIOACTIVE SEED IMPLANT N/A 02/08/2019   Procedure: RADIOACTIVE SEED IMPLANT/BRACHYTHERAPY IMPLANT;  Surgeon: Cleon Gustin, MD;  Location: [facility];  Service: Urology;  Laterality: N/A;   right knee arthroscopy  1971   exploratory surgery for ligement repair   SPACE OAR INSTILLATION N/A 02/08/2019   Procedure: SPACE OAR INSTILLATION;  Surgeon: Cleon Gustin, MD;  Location: [facility];  Service: Urology;  Laterality: N/A;    There were no vitals filed for this visit.   Subjective Assessment - 05/26/21 1643     Subjective Patient reports he made another trip to [place] today. The knee is stiff. In addition, he has had a cold for the past week and has some mild chest congestion.    Limitations Lifting;Sitting;Standing;Walking;House hold activities    Patient Stated Goals stronger, less pain, walk better    Currently in Pain? No/denies    Pain Onset In the past 7 days                               [facility] Adult PT Treatment/Exercise - 05/26/21 0001       Knee/Hip Exercises: Aerobic    Elliptical L1.5 x 3 minutes      Knee/Hip Exercises: Standing   Lateral Step Up Right;1 set;10 reps    Lateral Step Up Limitations Drive opposite leg up into hip flexion    Forward Step Up Right;1 set;10 reps    Forward Step Up Limitations Drive opposite leg up into hip flexion    Other Standing Knee Exercises SLS on non compliant surface. Mini knee lfexion, moving opposite leg forward and back and then side to side.      Knee/Hip Exercises: Prone   Other Prone Exercises side planks 2x15 seconds on each side, bolster under knee                       PT Short Term Goals - 04/30/21 0938       PT SHORT TERM GOAL #1   Title independent iwth initial HEP    Status Achieved               PT Long Term Goals - 05/19/21 1708       PT LONG TERM GOAL #1   Title walk with SPC or LRAD x 500 feet    Status Achieved      PT LONG TERM GOAL #2  Title increase AROM to 5-115 degrees flexion    Status Partially Met      PT LONG TERM GOAL #3   Title decrease TUG time to 13 seconds    Status Achieved      PT LONG TERM GOAL #4   Title go up and down stairs step over step    Status Achieved                   Plan - 05/26/21 1648     Clinical Impression Statement Patient arrived a few minutes late today. Continued to progress with weight training, also introduced some additional functional strengthening activities to develop coordinated strength, NM control in standing and gait activities.    PT Next Visit Plan work on safe transition to the gym    Consulted and Agree with Plan of Care Patient             Patient will benefit from skilled therapeutic intervention in order to improve the following deficits and impairments:  Abnormal gait, Decreased range of motion, Difficulty walking, Pain, Decreased balance, Decreased scar mobility, Impaired flexibility, Increased edema, Decreased strength, Decreased mobility  Visit Diagnosis: Stiffness of right knee, not  elsewhere classified  Difficulty in walking, not elsewhere classified  Acute pain of right knee  Localized edema     Problem List Patient Active Problem List   Diagnosis Date Noted   Malignant neoplasm of prostate ([place]) 10/14/2018    Marcelina Morel, DPT 05/26/2021, 6:52 PM  [place]. [place], [place], [address] Phone: [phone]   Fax:  [phone]  Name: Nicholas Levine MRN: [id number] Date of Birth: [birth year]

## 2021-05-28 ENCOUNTER — Encounter: Payer: Self-pay | Admitting: Physical Therapy

## 2021-05-28 ENCOUNTER — Other Ambulatory Visit: Payer: Self-pay

## 2021-05-28 ENCOUNTER — Ambulatory Visit: Payer: 59 | Admitting: Physical Therapy

## 2021-05-28 DIAGNOSIS — R262 Difficulty in walking, not elsewhere classified: Secondary | ICD-10-CM

## 2021-05-28 DIAGNOSIS — M25561 Pain in right knee: Secondary | ICD-10-CM

## 2021-05-28 DIAGNOSIS — M25661 Stiffness of right knee, not elsewhere classified: Secondary | ICD-10-CM

## 2021-05-28 DIAGNOSIS — R600 Localized edema: Secondary | ICD-10-CM

## 2021-05-28 NOTE — Therapy (Signed)
[address]. [place], [place], [address] Phone: [phone]   Fax:  [phone]  Physical Therapy Treatment  Patient Details  Name: Nicholas Levine MRN: [id number] Date of Birth: 1946-03-24 Referring Provider (PT): Aluisio   Encounter Date: 05/28/2021   PT End of Session - 05/28/21 1119     Visit Number 14    PT Start Time 1101    PT Stop Time 5035    PT Time Calculation (min) 48 min    Activity Tolerance Patient tolerated treatment well    Behavior During Therapy [facility] for tasks assessed/performed             Past Medical History:  Diagnosis Date   Hypothyroidism    Prostate cancer [facility])     Past Surgical History:  Procedure Laterality Date   PROSTATE BIOPSY     radioactive iodine  1975   to slow thryoid   RADIOACTIVE SEED IMPLANT N/A 02/08/2019   Procedure: RADIOACTIVE SEED IMPLANT/BRACHYTHERAPY IMPLANT;  Surgeon: Cleon Gustin, MD;  Location: [facility];  Service: Urology;  Laterality: N/A;   right knee arthroscopy  1971   exploratory surgery for ligement repair   SPACE OAR INSTILLATION N/A 02/08/2019   Procedure: SPACE OAR INSTILLATION;  Surgeon: Cleon Gustin, MD;  Location: [facility];  Service: Urology;  Laterality: N/A;    There were no vitals filed for this visit.   Subjective Assessment - 05/28/21 1107     Subjective Doing pretty good with the knee, a little stiff at time, he reports having a cold so his stamina is less    Currently in Pain? No/denies                [facility] PT Assessment - 05/28/21 0001       AROM   Right Knee Extension 2    Right Knee Flexion 116      PROM   Right Knee Extension 0    Right Knee Flexion 120                           [facility] Adult PT Treatment/Exercise - 05/28/21 0001       Knee/Hip Exercises: Aerobic   Elliptical level 5 6 minutes    Recumbent Bike level 5 x 6 minutes       Knee/Hip Exercises: Machines for Strengthening   Cybex Knee Extension 20# both 3x10    Cybex Knee Flexion 55# both 3x10    Cybex Leg Press 60# 2x10    Other Machine seated row 35# 3x10, lats 35# 3x10, chest press 20# 3x10      Manual Therapy   Passive ROM for flexion and extension                       PT Short Term Goals - 04/30/21 4656       PT SHORT TERM GOAL #1   Title independent iwth initial HEP    Status Achieved               PT Long Term Goals - 05/28/21 1132       PT LONG TERM GOAL #1   Title walk with SPC or LRAD x 500 feet    Status Achieved      PT LONG TERM GOAL #2   Title increase AROM to 5-115 degrees flexion    Status Achieved  PT LONG TERM GOAL #3   Title decrease TUG time to 13 seconds    Status Achieved      PT LONG TERM GOAL #4   Title go up and down stairs step over step    Status Achieved                   Plan - 05/28/21 1120     Clinical Impression Statement Patient doing great, no pain, good ROM, struggling with a cold over the past week.  He is now back to work fully wihtout limitation.  He is planning on going to a gym, a lot of time today was spent writing up a program and talking about form, reps, sets and precautions.  Answered his quesitons regarding this.    PT Next Visit Plan will d/c with him going to gym on his own    Consulted and Agree with Plan of Care Patient             Patient will benefit from skilled therapeutic intervention in order to improve the following deficits and impairments:  Abnormal gait, Decreased range of motion, Difficulty walking, Pain, Decreased balance, Decreased scar mobility, Impaired flexibility, Increased edema, Decreased strength, Decreased mobility  Visit Diagnosis: Stiffness of right knee, not elsewhere classified  Difficulty in walking, not elsewhere classified  Acute pain of right knee  Localized edema     Problem List Patient Active Problem List    Diagnosis Date Noted   Malignant neoplasm of prostate ([place]) 10/14/2018    Sumner Boast, PT 05/28/2021, 11:50 AM  [place]. [place], [place], [address] Phone: [phone]   Fax:  [phone]  Name: Sasuke Yaffe MRN: [id number] Date of Birth: June 23, 1945

## 2021-07-05 ENCOUNTER — Other Ambulatory Visit: Payer: 59

## 2021-07-05 ENCOUNTER — Other Ambulatory Visit: Payer: Self-pay

## 2021-07-05 DIAGNOSIS — C61 Malignant neoplasm of prostate: Secondary | ICD-10-CM

## 2021-07-06 LAB — PSA: Prostate Specific Ag, Serum: 0.1 ng/mL (ref 0.0–4.0)

## 2021-07-12 ENCOUNTER — Other Ambulatory Visit: Payer: Self-pay

## 2021-07-12 ENCOUNTER — Ambulatory Visit: Payer: 59 | Admitting: Urology

## 2021-07-12 VITALS — BP 156/74 | HR 66

## 2021-07-12 DIAGNOSIS — C61 Malignant neoplasm of prostate: Secondary | ICD-10-CM | POA: Diagnosis not present

## 2021-07-12 DIAGNOSIS — R351 Nocturia: Secondary | ICD-10-CM

## 2021-07-12 DIAGNOSIS — N138 Other obstructive and reflux uropathy: Secondary | ICD-10-CM | POA: Diagnosis not present

## 2021-07-12 DIAGNOSIS — N401 Enlarged prostate with lower urinary tract symptoms: Secondary | ICD-10-CM | POA: Diagnosis not present

## 2021-07-12 LAB — URINALYSIS, ROUTINE W REFLEX MICROSCOPIC
Bilirubin, UA: NEGATIVE
Glucose, UA: NEGATIVE
Ketones, UA: NEGATIVE
Leukocytes,UA: NEGATIVE
Nitrite, UA: NEGATIVE
Protein,UA: NEGATIVE
RBC, UA: NEGATIVE
Specific Gravity, UA: 1.025 (ref 1.005–1.030)
Urobilinogen, Ur: 0.2 mg/dL (ref 0.2–1.0)
pH, UA: 5.5 (ref 5.0–7.5)

## 2021-07-12 NOTE — Progress Notes (Signed)
07/12/2021 1:42 PM   Nicholas Levine 17-Aug-1945 [id number]  Referring provider: Shirline Frees, MD [place],  [place] [address]  Followup prostate cancer   HPI: Mr Heyde is a 76yo here for followup for prostate cancer. He has been doing well since radiation therapy. PSA 0.1 decrease from 0.2. IPSS 4 QOL 0.  Urine stream strong. Nocturia 0-1x. No straining to urinate. No other complaints today   PMH: Past Medical History:  Diagnosis Date   Hypothyroidism    Prostate cancer [facility] [place])     Surgical History: Past Surgical History:  Procedure Laterality Date   PROSTATE BIOPSY     radioactive iodine  1975   to slow thryoid   RADIOACTIVE SEED IMPLANT N/A 02/08/2019   Procedure: RADIOACTIVE SEED IMPLANT/BRACHYTHERAPY IMPLANT;  Surgeon: Cleon Gustin, MD;  Location: [facility];  Service: Urology;  Laterality: N/A;   right knee arthroscopy  1971   exploratory surgery for ligement repair   SPACE OAR INSTILLATION N/A 02/08/2019   Procedure: SPACE OAR INSTILLATION;  Surgeon: Cleon Gustin, MD;  Location: [facility];  Service: Urology;  Laterality: N/A;    Home Medications:  Allergies as of 07/12/2021       Reactions   Sulfa Antibiotics         Medication List        Accurate as of July 12, 2021  1:42 PM. If you have any questions, ask your nurse or doctor.          levothyroxine 50 MCG tablet Commonly known as: SYNTHROID Take 150 mcg by mouth daily.   MULTIVITAMIN ADULT PO Take by mouth.   NON FORMULARY OTC prostate supplement        Allergies:  Allergies  Allergen Reactions   Sulfa Antibiotics     Family History: Family History  Problem Relation Age of Onset   Leukemia Maternal Aunt    Leukemia Paternal Aunt     Social History:  reports that he has quit smoking. His smoking use included cigarettes. He has a 3.25 pack-year smoking history. He has never used smokeless tobacco. He  reports that he does not currently use alcohol. He reports that he does not use drugs.  ROS: All other review of systems were reviewed and are negative except what is noted above in HPI  Physical Exam: BP (!) 156/74    Pulse 66   Constitutional:  Alert and oriented, No acute distress. HEENT:  AT, moist mucus membranes.  Trachea midline, no masses. Cardiovascular: No clubbing, cyanosis, or edema. Respiratory: Normal respiratory effort, no increased work of breathing. GI: Abdomen is soft, nontender, nondistended, no abdominal masses GU: No CVA tenderness.  Lymph: No cervical or inguinal lymphadenopathy. Skin: No rashes, bruises or suspicious lesions. Neurologic: Grossly intact, no focal deficits, moving all 4 extremities. Psychiatric: Normal mood and affect.  Laboratory Data: Lab Results  Component Value Date   WBC 2.8 (L) 02/05/2019   HGB 15.0 02/05/2019   HCT 43.5 02/05/2019   MCV 96.0 02/05/2019   PLT 170 02/05/2019    Lab Results  Component Value Date   CREATININE 0.93 02/05/2019    No results found for: PSA  No results found for: TESTOSTERONE  No results found for: HGBA1C  Urinalysis    Component Value Date/Time   APPEARANCEUR Clear 07/07/2020 1509   GLUCOSEU Negative 07/07/2020 1509   BILIRUBINUR Negative 07/07/2020 1509   PROTEINUR Trace (A) 07/07/2020 1509   NITRITE Negative  07/07/2020 1509   LEUKOCYTESUR Negative 07/07/2020 1509    Lab Results  Component Value Date   LABMICR Comment 07/07/2020    Pertinent Imaging:  No results found for this or any previous visit.  No results found for this or any previous visit.  No results found for this or any previous visit.  No results found for this or any previous visit.  No results found for this or any previous visit.  No results found for this or any previous visit.  No results found for this or any previous visit.  No results found for this or any previous visit.   Assessment & Plan:     1. Prostate cancer ([place]) -RTC 6 months with PSA - Urinalysis, Routine w reflex microscopic  2. Benign prostatic hyperplasia with urinary obstruction -Continue observation since the patient is not bothered by his LUTS  3. Nocturia -Continue fluid management prior to going to bed   No follow-ups on file.  Nicolette Bang, MD  [facility] [place]

## 2021-07-19 ENCOUNTER — Encounter: Payer: Self-pay | Admitting: Urology

## 2021-07-19 NOTE — Patient Instructions (Signed)
Prostate Cancer °The prostate is a small gland that helps make semen. It is located below a man's bladder, in front of the rectum. Prostate cancer is when abnormal cells grow in this gland. °What are the causes? °The cause of this condition is not known. °What increases the risk? °Being age 74 or older. °Having a family history of prostate cancer. °Having a family history of cancer of the breasts or ovaries. °Having genes that are passed from parent to child (inherited). °Having Lynch syndrome. °African American men and men of African descent are diagnosed with prostate cancer at higher rates than other men. °What are the signs or symptoms? °Problems peeing (urinating). This may include: °A stream that is weak, or pee that stops and starts. °Trouble starting or stopping your pee. °Trouble emptying all of your pee. °Needing to pee more often, especially at night. °Blood in your pee or semen. °Pain in the: °Lower back. °Lower belly (abdomen). °Hips. °Trouble getting an erection. °Weakness or numbness in the legs or feet. °How is this treated? °Treatment for this condition depends on: °How much the cancer has spread. °Your age. °The kind of treatment you want. °Your health. °Treatments include: °Being watched. This is called observation. You will be tested from time to time, but you will not get treated. Tests are to make sure that the cancer is not growing. °Surgery. This may be done to: °Take out (remove) the prostate. °Freeze and kill cancer cells. °Radiation. This uses a strong beam of energy to kill cancer cells. °Chemotherapy. This uses medicines that stop cancer cells from increasing. This kills cancer cells and healthy cells. °Targeted therapy. This kills cancer cells only. Healthy cells are not affected. °Hormone treatment. This stops the body from making hormones that help the cancer cells grow. °Follow these instructions at home: °Lifestyle °Do not smoke or use any products that contain nicotine or tobacco.  If you need help quitting, ask your doctor. °Eat a healthy diet. °Treatment may affect your ability to have sex. If you have a partner, touch, hold, hug, and caress your partner to have intimate moments. °Get plenty of sleep. °Ask your doctor for help to find a support group for men with prostate cancer. °General instructions °Take over-the-counter and prescription medicines only as told by your doctor. °If you have to go to the hospital, let your cancer doctor (oncologist) know. °Keep all follow-up visits. °Where to find more information °American Cancer Society: www.cancer.org °American Society of Clinical Oncology: www.cancer.net °National Cancer Institute: www.cancer.gov °Contact a doctor if: °You have new or more trouble peeing. °You have new or more blood in your pee. °You have new or more pain in your hips, back, or chest. °Get help right away if: °You have weakness in your legs. °You lose feeling in your legs. °You cannot control your pee or your poop (stool). °You have chills or a fever. °Summary °The prostate is a male gland that helps make semen. °Prostate cancer is when abnormal cells grow in this gland. °Treatment includes doing surgery, using medicines, using strong beams of energy, or watching without treatment. °Ask your doctor for help to find a support group for men with prostate cancer. °Contact a doctor if you have problems peeing or have any new pain that you did not have before. °This information is not intended to replace advice given to you by your health care provider. Make sure you discuss any questions you have with your health care provider. °Document Revised: 08/12/2020 Document Reviewed: 08/12/2020 °Elsevier   Patient Education © 2022 Elsevier Inc. ° °

## 2021-11-17 ENCOUNTER — Encounter: Payer: Self-pay | Admitting: Physical Therapy

## 2021-11-17 NOTE — Therapy (Signed)
[facility] [address] [place], [place], [address] Phone: [phone]   Fax:  [phone]  Patient Details  Name: Nicholas Levine MRN: [id number] Date of Birth: 10/20/1945 Referring Provider:  No ref. provider found  Encounter Date: 11/17/2021  PHYSICAL THERAPY DISCHARGE SUMMARY  Visits from Start of Care: 14  Current functional level related to goals / functional outcomes: All goals met, DC to home program at the gym    Remaining deficits: Unable to assess    Education / Equipment: N/A    Patient agrees to discharge. Patient goals were met. Patient is being discharged due to being pleased with the current functional level.  Kristen U PT, DPT, PN2   Supplemental Physical Therapist [place]       [facility] [address] [place], [place], [address] Phone: [phone]   Fax:  [phone] 

## 2022-01-03 ENCOUNTER — Other Ambulatory Visit: Payer: Medicare Other

## 2022-01-03 DIAGNOSIS — C61 Malignant neoplasm of prostate: Secondary | ICD-10-CM

## 2022-01-04 LAB — PSA: Prostate Specific Ag, Serum: 0.1 ng/mL (ref 0.0–4.0)

## 2022-01-10 ENCOUNTER — Ambulatory Visit (INDEPENDENT_AMBULATORY_CARE_PROVIDER_SITE_OTHER): Payer: Medicare Other | Admitting: Urology

## 2022-01-10 ENCOUNTER — Encounter: Payer: Self-pay | Admitting: Urology

## 2022-01-10 VITALS — BP 139/64 | HR 64 | Ht 75.0 in | Wt 242.0 lb

## 2022-01-10 DIAGNOSIS — Z8546 Personal history of malignant neoplasm of prostate: Secondary | ICD-10-CM | POA: Diagnosis not present

## 2022-01-10 DIAGNOSIS — N5201 Erectile dysfunction due to arterial insufficiency: Secondary | ICD-10-CM

## 2022-01-10 DIAGNOSIS — N401 Enlarged prostate with lower urinary tract symptoms: Secondary | ICD-10-CM

## 2022-01-10 DIAGNOSIS — R351 Nocturia: Secondary | ICD-10-CM | POA: Diagnosis not present

## 2022-01-10 DIAGNOSIS — N138 Other obstructive and reflux uropathy: Secondary | ICD-10-CM

## 2022-01-10 DIAGNOSIS — C61 Malignant neoplasm of prostate: Secondary | ICD-10-CM

## 2022-01-10 MED ORDER — TADALAFIL 20 MG PO TABS: 20.0000 mg | ORAL_TABLET | Freq: Every day | ORAL | 5 refills | Status: DC | PRN

## 2022-01-10 NOTE — Progress Notes (Signed)
01/10/2022 1:30 PM   Nicholas Levine [birth year] [id number]  Referring provider: Shirline Frees, MD [place],  [place] [address]  Followup prostate cancer, BPH   HPI: Nicholas Levine is a 76yo here for followup for prostate cancer and BPH. PSA stable at 0.1. IPSS 4 QOL 1. Nocturia 0-1x. He has issues getting and maintaining and erection for the past 6 months. Good libido. Good exercise tolerance.    PMH: Past Medical History:  Diagnosis Date   Hypothyroidism    Prostate cancer [facility])     Surgical History: Past Surgical History:  Procedure Laterality Date   PROSTATE BIOPSY     radioactive iodine  1975   to slow thryoid   RADIOACTIVE SEED IMPLANT N/A 02/08/2019   Procedure: RADIOACTIVE SEED IMPLANT/BRACHYTHERAPY IMPLANT;  Surgeon: Cleon Gustin, MD;  Location: [facility];  Service: Urology;  Laterality: N/A;   right knee arthroscopy  1971   exploratory surgery for ligement repair   SPACE OAR INSTILLATION N/A 02/08/2019   Procedure: SPACE OAR INSTILLATION;  Surgeon: Cleon Gustin, MD;  Location: [facility];  Service: Urology;  Laterality: N/A;    Home Medications:  Allergies as of 01/10/2022       Reactions   Sulfa Antibiotics         Medication List        Accurate as of January 10, 2022  1:30 PM. If you have any questions, ask your nurse or doctor.          levothyroxine 50 MCG tablet Commonly known as: SYNTHROID Take 150 mcg by mouth daily.   MULTIVITAMIN ADULT PO Take by mouth.   NON FORMULARY OTC prostate supplement        Allergies:  Allergies  Allergen Reactions   Sulfa Antibiotics     Family History: Family History  Problem Relation Age of Onset   Leukemia Maternal Aunt    Leukemia Paternal Aunt     Social History:  reports that he has quit smoking. His smoking use included cigarettes. He has a 3.25 pack-year smoking history. He has never used smokeless tobacco. He  reports that he does not currently use alcohol. He reports that he does not use drugs.  ROS: All other review of systems were reviewed and are negative except what is noted above in HPI  Physical Exam: BP 139/64   Pulse 64   Ht '6\' 3"'$  (1.905 m)   Wt 242 lb (109.8 kg)   BMI 30.25 kg/m   Constitutional:  Alert and oriented, No acute distress. HEENT: [place] AT, moist mucus membranes.  Trachea midline, no masses. Cardiovascular: No clubbing, cyanosis, or edema. Respiratory: Normal respiratory effort, no increased work of breathing. GI: Abdomen is soft, nontender, nondistended, no abdominal masses GU: No CVA tenderness.  Lymph: No cervical or inguinal lymphadenopathy. Skin: No rashes, bruises or suspicious lesions. Neurologic: Grossly intact, no focal deficits, moving all 4 extremities. Psychiatric: Normal mood and affect.  Laboratory Data: Lab Results  Component Value Date   WBC 2.8 (L) 02/05/2019   HGB 15.0 02/05/2019   HCT 43.5 02/05/2019   MCV 96.0 02/05/2019   PLT 170 02/05/2019    Lab Results  Component Value Date   CREATININE 0.93 02/05/2019    No results found for: "PSA"  No results found for: "TESTOSTERONE"  No results found for: "HGBA1C"  Urinalysis    Component Value Date/Time   APPEARANCEUR Clear 07/12/2021 1323   GLUCOSEU Negative 07/12/2021  [place] Negative 07/12/2021 1323   PROTEINUR Negative 07/12/2021 1323   NITRITE Negative 07/12/2021 1323   LEUKOCYTESUR Negative 07/12/2021 1323    Lab Results  Component Value Date   LABMICR Comment 07/12/2021    Pertinent Imaging:  No results found for this or any previous visit.  No results found for this or any previous visit.  No results found for this or any previous visit.  No results found for this or any previous visit.  No results found for this or any previous visit.  No results found for this or any previous visit.  No results found for this or any previous visit.  No results  found for this or any previous visit.   Assessment & Plan:    1. Benign prostatic hyperplasia with urinary obstruction -continue observation   2. Prostate cancer ([place]) -RTC 6 months with PSA - Urinalysis, Routine w reflex microscopic  3. Nocturia -decrease fluid intake within 2 hours of going to bed  4. Erectile dysfunction due to arterial insufficiency -We will trial tadalafil '20mg'$  prn   No follow-ups on file.  Nicolette Bang, MD  [facility] [place]

## 2022-01-10 NOTE — Patient Instructions (Signed)
Erectile Dysfunction ?Erectile dysfunction (ED) is the inability to get or keep an erection in order to have sexual intercourse. ED is considered a symptom of an underlying disorder and is not considered a disease. ED may include: ?Inability to get an erection. ?Lack of enough hardness of the erection to allow penetration. ?Loss of erection before sex is finished. ?What are the causes? ?This condition may be caused by: ?Physical causes, such as: ?Artery problems. This may include heart disease, high blood pressure, atherosclerosis, and diabetes. ?Hormonal problems, such as low testosterone. ?Obesity. ?Nerve problems. This may include back or pelvic injuries, multiple sclerosis, Parkinson's disease, spinal cord injury, and stroke. ?Certain medicines, such as: ?Pain relievers. ?Antidepressants. ?Blood pressure medicines and water pills (diuretics). ?Cancer medicines. ?Antihistamines. ?Muscle relaxants. ?Lifestyle factors, such as: ?Use of drugs such as marijuana, cocaine, or opioids. ?Excessive use of alcohol. ?Smoking. ?Lack of physical activity or exercise. ?Psychological causes, such as: ?Anxiety or stress. ?Sadness or depression. ?Exhaustion. ?Fear about sexual performance. ?Guilt. ?What are the signs or symptoms? ?Symptoms of this condition include: ?Inability to get an erection. ?Lack of enough hardness of the erection to allow penetration. ?Loss of the erection before sex is finished. ?Sometimes having normal erections, but with frequent unsatisfactory episodes. ?Low sexual satisfaction in either partner due to erection problems. ?A curved penis occurring with erection. The curve may cause pain, or the penis may be too curved to allow for intercourse. ?Never having nighttime or morning erections. ?How is this diagnosed? ?This condition is often diagnosed by: ?Performing a physical exam to find other diseases or specific problems with the penis. ?Asking you detailed questions about the problem. ?Doing tests,  such as: ?Blood tests to check for diabetes mellitus or high cholesterol, or to measure hormone levels. ?Other tests to check for underlying health conditions. ?An ultrasound exam to check for scarring. ?A test to check blood flow to the penis. ?Doing a sleep study at home to measure nighttime erections. ?How is this treated? ?This condition may be treated by: ?Medicines, such as: ?Medicine taken by mouth to help you achieve an erection (oral medicine). ?Hormone replacement therapy to replace low testosterone levels. ?Medicine that is injected into the penis. Your health care provider may instruct you how to give yourself these injections at home. ?Medicine that is delivered with a short applicator tube. The tube is inserted into the opening at the tip of the penis, which is the opening of the urethra. A tiny pellet of medicine is put in the urethra. The pellet dissolves and enhances erectile function. This is also called MUSE (medicated urethral system for erections) therapy. ?Vacuum pump. This is a pump with a ring on it. The pump and ring are placed on the penis and used to create pressure that helps the penis become erect. ?Penile implant surgery. In this procedure, you may receive: ?An inflatable implant. This consists of cylinders, a pump, and a reservoir. The cylinders can be inflated with a fluid that helps to create an erection, and they can be deflated after intercourse. ?A semi-rigid implant. This consists of two silicone rubber rods. The rods provide some rigidity. They are also flexible, so the penis can both curve downward in its normal position and become straight for sexual intercourse. ?Blood vessel surgery to improve blood flow to the penis. During this procedure, a blood vessel from a different part of the body is placed into the penis to allow blood to flow around (bypass) damaged or blocked blood vessels. ?Lifestyle changes,   such as exercising more, losing weight, and quitting smoking. ?Follow  these instructions at home: ?Medicines ? ?Take over-the-counter and prescription medicines only as told by your health care provider. Do not increase the dosage without first discussing it with your health care provider. ?If you are using self-injections, do injections as directed by your health care provider. Make sure you avoid any veins that are on the surface of the penis. After giving an injection, apply pressure to the injection site for 5 minutes. ?Talk to your health care provider about how to prevent headaches while taking ED medicines. These medicines may cause a sudden headache due to the increase in blood flow in your body. ?General instructions ?Exercise regularly, as directed by your health care provider. Work with your health care provider to lose weight, if needed. ?Do not use any products that contain nicotine or tobacco. These products include cigarettes, chewing tobacco, and vaping devices, such as e-cigarettes. If you need help quitting, ask your health care provider. ?Before using a vacuum pump, read the instructions that come with the pump and discuss any questions with your health care provider. ?Keep all follow-up visits. This is important. ?Contact a health care provider if: ?You feel nauseous. ?You are vomiting. ?You get sudden headaches while taking ED medicines. ?You have any concerns about your sexual health. ?Get help right away if: ?You are taking oral or injectable medicines and you have an erection that lasts longer than 4 hours. If your health care provider is unavailable, go to the nearest emergency room for evaluation. An erection that lasts much longer than 4 hours can result in permanent damage to your penis. ?You have severe pain in your groin or abdomen. ?You develop redness or severe swelling of your penis. ?You have redness spreading at your groin or lower abdomen. ?You are unable to urinate. ?You experience chest pain or a rapid heartbeat (palpitations) after taking oral  medicines. ?These symptoms may represent a serious problem that is an emergency. Do not wait to see if the symptoms will go away. Get medical help right away. Call your local emergency services (911 in the [place] Do not drive yourself to the hospital. ?Summary ?Erectile dysfunction (ED) is the inability to get or keep an erection during sexual intercourse. ?This condition is diagnosed based on a physical exam, your symptoms, and tests to determine the cause. Treatment varies depending on the cause and may include medicines, hormone therapy, surgery, or a vacuum pump. ?You may need follow-up visits to make sure that you are using your medicines or devices correctly. ?Get help right away if you are taking or injecting medicines and you have an erection that lasts longer than 4 hours. ?This information is not intended to replace advice given to you by your health care provider. Make sure you discuss any questions you have with your health care provider. ?Document Revised: 08/12/2020 Document Reviewed: 08/12/2020 ?Elsevier Patient Education ? 2023 Elsevier Inc. ? ?

## 2022-01-12 LAB — URINALYSIS, ROUTINE W REFLEX MICROSCOPIC
Bilirubin, UA: NEGATIVE
Glucose, UA: NEGATIVE
Ketones, UA: NEGATIVE
Leukocytes,UA: NEGATIVE
Nitrite, UA: NEGATIVE
Protein,UA: NEGATIVE
RBC, UA: NEGATIVE
Specific Gravity, UA: 1.025 (ref 1.005–1.030)
Urobilinogen, Ur: 0.2 mg/dL (ref 0.2–1.0)
pH, UA: 5.5 (ref 5.0–7.5)

## 2022-07-13 ENCOUNTER — Encounter: Payer: Self-pay | Admitting: Urology

## 2022-07-13 ENCOUNTER — Ambulatory Visit (INDEPENDENT_AMBULATORY_CARE_PROVIDER_SITE_OTHER): Payer: Medicare Other | Admitting: Urology

## 2022-07-13 VITALS — BP 161/69 | HR 51

## 2022-07-13 DIAGNOSIS — N138 Other obstructive and reflux uropathy: Secondary | ICD-10-CM

## 2022-07-13 DIAGNOSIS — C61 Malignant neoplasm of prostate: Secondary | ICD-10-CM | POA: Diagnosis not present

## 2022-07-13 DIAGNOSIS — N5201 Erectile dysfunction due to arterial insufficiency: Secondary | ICD-10-CM

## 2022-07-13 DIAGNOSIS — N401 Enlarged prostate with lower urinary tract symptoms: Secondary | ICD-10-CM | POA: Diagnosis not present

## 2022-07-13 DIAGNOSIS — R351 Nocturia: Secondary | ICD-10-CM

## 2022-07-13 MED ORDER — TADALAFIL 20 MG PO TABS: 20.0000 mg | ORAL_TABLET | Freq: Every day | ORAL | 5 refills | Status: DC | PRN

## 2022-07-13 NOTE — Progress Notes (Unsigned)
07/13/2022 2:18 PM   Nicholas Levine Jul 14, 1945 [id number]  Referring provider: Shirline Frees, MD [address] [place],  [place] [address]  Followup BPh, erectile dysfunction and prostate cancer   HPI: Nicholas Levine is a 77yo here for followup for BPH, erectile dysfunction and prostate cancer. IPSS 2 QOL 1 on no therapy. Nocturia 0-2x depending on fluid consumption. He uses tadalafil 56m prn for his erectile dysfunction which works well. No recent PSA   PMH: Past Medical History:  Diagnosis Date   Hypothyroidism    Prostate cancer ([facility]     Surgical History: Past Surgical History:  Procedure Laterality Date   PROSTATE BIOPSY     radioactive iodine  1975   to slow thryoid   RADIOACTIVE SEED IMPLANT N/A 02/08/2019   Procedure: RADIOACTIVE SEED IMPLANT/BRACHYTHERAPY IMPLANT;  Surgeon: MCleon Gustin MD;  [facility]  Service: Urology;  Laterality: N/A;   right knee arthroscopy  1971   exploratory surgery for ligement repair   SPACE OAR INSTILLATION N/A 02/08/2019   Procedure: SPACE OAR INSTILLATION;  Surgeon: MCleon Gustin MD;  [facility]  Service: Urology;  Laterality: N/A;    Home Medications:  Allergies as of 07/13/2022       Reactions   Sulfa Antibiotics         Medication List        Accurate as of July 13, 2022  2:18 PM. If you have any questions, ask your nurse or doctor.          levothyroxine 50 MCG tablet Commonly known as: SYNTHROID Take 150 mcg by mouth daily.   MULTIVITAMIN ADULT PO Take by mouth.   NON FORMULARY OTC prostate supplement   tadalafil 20 MG tablet Commonly known as: CIALIS Take 1 tablet (20 mg total) by mouth daily as needed.        Allergies:  Allergies  Allergen Reactions   Sulfa Antibiotics     Family History: Family History  Problem Relation Age of Onset   Leukemia Maternal Aunt    Leukemia Paternal Aunt     Social History:   reports that he has quit smoking. His smoking use included cigarettes. He has a 3.25 pack-year smoking history. He has never used smokeless tobacco. He reports that he does not currently use alcohol. He reports that he does not use drugs.  ROS: All other review of systems were reviewed and are negative except what is noted above in HPI  Physical Exam: BP (!) 161/69   Pulse (!) 51   Constitutional:  Alert and oriented, No acute distress. HEENT: [place] AT, moist mucus membranes.  Trachea midline, no masses. Cardiovascular: No clubbing, cyanosis, or edema. Respiratory: Normal respiratory effort, no increased work of breathing. GI: Abdomen is soft, nontender, nondistended, no abdominal masses GU: No CVA tenderness.  Lymph: No cervical or inguinal lymphadenopathy. Skin: No rashes, bruises or suspicious lesions. Neurologic: Grossly intact, no focal deficits, moving all 4 extremities. Psychiatric: Normal mood and affect.  Laboratory Data: Lab Results  Component Value Date   WBC 2.8 (L) 02/05/2019   HGB 15.0 02/05/2019   HCT 43.5 02/05/2019   MCV 96.0 02/05/2019   PLT 170 02/05/2019    Lab Results  Component Value Date   CREATININE 0.93 02/05/2019    No results found for: "PSA"  No results found for: "TESTOSTERONE"  No results found for: "HGBA1C"  Urinalysis    Component Value Date/Time   APPEARANCEUR  Hazy (A) 01/10/2022 1325   GLUCOSEU Negative 01/10/2022 1325   BILIRUBINUR Negative 01/10/2022 1325   PROTEINUR Negative 01/10/2022 1325   NITRITE Negative 01/10/2022 1325   LEUKOCYTESUR Negative 01/10/2022 1325    Lab Results  Component Value Date   LABMICR Comment 07/12/2021    Pertinent Imaging:  No results found for this or any previous visit.  No results found for this or any previous visit.  No results found for this or any previous visit.  No results found for this or any previous visit.  No results found for this or any previous visit.  No valid procedures  specified. No results found for this or any previous visit.  No results found for this or any previous visit.   Assessment & Plan:    1. Prostate cancer ([place]) -PSA today, if stable he will followup in 1 year with a PSA   2. Benign prostatic hyperplasia with urinary obstruction -patient defers therapy at this time  3. Nocturia Patient to decrease fluid consumption within 2 hours of going to bed  4. Erectile dysfunction due to arterial insufficiency Tadalafil 19m prn   No follow-ups on file.  PNicolette Bang MD  [facility] [place]

## 2022-07-13 NOTE — Patient Instructions (Signed)

## 2022-07-14 LAB — URINALYSIS, ROUTINE W REFLEX MICROSCOPIC
Bilirubin, UA: NEGATIVE
Glucose, UA: NEGATIVE
Ketones, UA: NEGATIVE
Leukocytes,UA: NEGATIVE
Nitrite, UA: NEGATIVE
RBC, UA: NEGATIVE
Specific Gravity, UA: 1.02 (ref 1.005–1.030)
Urobilinogen, Ur: 0.2 mg/dL (ref 0.2–1.0)
pH, UA: 6 (ref 5.0–7.5)

## 2022-07-14 LAB — MICROSCOPIC EXAMINATION
Bacteria, UA: NONE SEEN
RBC, Urine: NONE SEEN /hpf (ref 0–2)

## 2022-07-15 LAB — PSA: Prostate Specific Ag, Serum: 0.2 ng/mL (ref 0.0–4.0)

## 2023-07-03 ENCOUNTER — Other Ambulatory Visit: Payer: Medicare Other

## 2023-07-03 DIAGNOSIS — C61 Malignant neoplasm of prostate: Secondary | ICD-10-CM

## 2023-07-04 LAB — PSA: Prostate Specific Ag, Serum: 0.6 ng/mL (ref 0.0–4.0)

## 2023-07-10 ENCOUNTER — Encounter: Payer: Self-pay | Admitting: Urology

## 2023-07-10 ENCOUNTER — Ambulatory Visit: Payer: Medicare Other | Admitting: Urology

## 2023-07-10 VITALS — BP 146/75 | HR 70

## 2023-07-10 DIAGNOSIS — C61 Malignant neoplasm of prostate: Secondary | ICD-10-CM

## 2023-07-10 DIAGNOSIS — N5201 Erectile dysfunction due to arterial insufficiency: Secondary | ICD-10-CM

## 2023-07-10 DIAGNOSIS — N138 Other obstructive and reflux uropathy: Secondary | ICD-10-CM

## 2023-07-10 DIAGNOSIS — R351 Nocturia: Secondary | ICD-10-CM | POA: Diagnosis not present

## 2023-07-10 DIAGNOSIS — R972 Elevated prostate specific antigen [PSA]: Secondary | ICD-10-CM | POA: Diagnosis not present

## 2023-07-10 DIAGNOSIS — N401 Enlarged prostate with lower urinary tract symptoms: Secondary | ICD-10-CM

## 2023-07-10 LAB — URINALYSIS, ROUTINE W REFLEX MICROSCOPIC
Bilirubin, UA: NEGATIVE
Glucose, UA: NEGATIVE
Ketones, UA: NEGATIVE
Leukocytes,UA: NEGATIVE
Nitrite, UA: NEGATIVE
RBC, UA: NEGATIVE
Specific Gravity, UA: 1.025 (ref 1.005–1.030)
Urobilinogen, Ur: 1 mg/dL (ref 0.2–1.0)
pH, UA: 6.5 (ref 5.0–7.5)

## 2023-07-10 MED ORDER — TADALAFIL 20 MG PO TABS: 20.0000 mg | ORAL_TABLET | Freq: Every day | ORAL | 11 refills | Status: DC | PRN

## 2023-07-10 NOTE — Progress Notes (Signed)
07/10/2023 1:59 PM   Rodney Cruise February 21, 1946 [id number]  Referring provider: Noberto Retort, MD [phone] [place],  [place] [address]  Nocturia./   HPI: Mr Nicholas Levine is a 78yo here for folllowup for prostate cancer, BPH with nocturia and erectile dysfunction. PSA increased to 0.6 from 0.2. IPSS 5 QOL 1 on no BPH therapy. Uirne stream strong. No straining to urinate. Nocturia 1-2x depending on fluid consumption. He uses tadalafil 20mg  prn with good results   PMH: Past Medical History:  Diagnosis Date   Hypothyroidism    Prostate cancer [facility])     Surgical History: Past Surgical History:  Procedure Laterality Date   PROSTATE BIOPSY     radioactive iodine  1975   to slow thryoid   RADIOACTIVE SEED IMPLANT N/A 02/08/2019   Procedure: RADIOACTIVE SEED IMPLANT/BRACHYTHERAPY IMPLANT;  Surgeon: Malen Gauze, MD;  Location[facility];  Service: Urology;  Laterality: N/A;   right knee arthroscopy  1971   exploratory surgery for ligement repair   SPACE OAR INSTILLATION N/A 02/08/2019   Procedure: SPACE OAR INSTILLATION;  Surgeon: Malen Gauze, MD;  Location: [facility];  Service: Urology;  Laterality: N/A;    Home Medications:  Allergies as of 07/10/2023       Reactions   Sulfa Antibiotics         Medication List        Accurate as of July 10, 2023  1:59 PM. If you have any questions, ask your nurse or doctor.          levothyroxine 50 MCG tablet Commonly known as: SYNTHROID Take 150 mcg by mouth daily.   MULTIVITAMIN ADULT PO Take by mouth.   NON FORMULARY OTC prostate supplement   tadalafil 20 MG tablet Commonly known as: CIALIS Take 1 tablet (20 mg total) by mouth daily as needed.        Allergies:  Allergies  Allergen Reactions   Sulfa Antibiotics     Family History: Family History  Problem Relation Age of Onset   Leukemia Maternal Aunt    Leukemia Paternal Aunt      Social History:  reports that he has quit smoking. His smoking use included cigarettes. He has a 3.3 pack-year smoking history. He has never used smokeless tobacco. He reports that he does not currently use alcohol. He reports that he does not use drugs.  ROS: All other review of systems were reviewed and are negative except what is noted above in HPI  Physical Exam: BP (!) 146/75   Pulse 70   Constitutional:  Alert and oriented, No acute distress. HEENT: [place] AT, moist mucus membranes.  Trachea midline, no masses. Cardiovascular: No clubbing, cyanosis, or edema. Respiratory: Normal respiratory effort, no increased work of breathing. GI: Abdomen is soft, nontender, nondistended, no abdominal masses GU: No CVA tenderness.  Lymph: No cervical or inguinal lymphadenopathy. Skin: No rashes, bruises or suspicious lesions. Neurologic: Grossly intact, no focal deficits, moving all 4 extremities. Psychiatric: Normal mood and affect.  Laboratory Data: Lab Results  Component Value Date   WBC 2.8 (L) 02/05/2019   HGB 15.0 02/05/2019   HCT 43.5 02/05/2019   MCV 96.0 02/05/2019   PLT 170 02/05/2019    Lab Results  Component Value Date   CREATININE 0.93 02/05/2019    No results found for: "PSA"  No results found for: "TESTOSTERONE"  No results found for: "HGBA1C"  Urinalysis    Component Value Date/Time  APPEARANCEUR Hazy (A) 07/13/2022 1529   GLUCOSEU Negative 07/13/2022 1529   BILIRUBINUR Negative 07/13/2022 1529   PROTEINUR 1+ (A) 07/13/2022 1529   NITRITE Negative 07/13/2022 1529   LEUKOCYTESUR Negative 07/13/2022 1529    Lab Results  Component Value Date   LABMICR See below: 07/13/2022   WBCUA 0-5 07/13/2022   LABEPIT 0-10 07/13/2022   MUCUS Present (A) 07/13/2022   BACTERIA None seen 07/13/2022    Pertinent Imaging:  No results found for this or any previous visit.  No results found for this or any previous visit.  No results found for this or any  previous visit.  No results found for this or any previous visit.  No results found for this or any previous visit.  No results found for this or any previous visit.  No results found for this or any previous visit.  No results found for this or any previous visit.   Assessment & Plan:    1.  Prostate cancer (HCC) Followup 6 months with PSA  2. Benign prostatic hyperplasia with urinary obstruction Patient defers therapy at this time  3. Nocturia Decrease fluid intake within 2 hours of going to bed  4. Erectile dysfunction -tadalafil 20mg  prn   No follow-ups on file.  Wilkie Aye, MD  [facility] [place]

## 2023-07-10 NOTE — Patient Instructions (Signed)

## 2024-01-01 ENCOUNTER — Other Ambulatory Visit: Payer: Medicare Other

## 2024-01-01 DIAGNOSIS — C61 Malignant neoplasm of prostate: Secondary | ICD-10-CM

## 2024-01-02 LAB — PSA: Prostate Specific Ag, Serum: 1.3 ng/mL (ref 0.0–4.0)

## 2024-01-08 ENCOUNTER — Ambulatory Visit: Payer: Medicare Other | Admitting: Urology

## 2024-01-16 ENCOUNTER — Ambulatory Visit: Payer: Self-pay | Admitting: Urology

## 2024-01-22 ENCOUNTER — Encounter: Payer: Self-pay | Admitting: Urology

## 2024-01-22 ENCOUNTER — Ambulatory Visit (INDEPENDENT_AMBULATORY_CARE_PROVIDER_SITE_OTHER): Admitting: Urology

## 2024-01-22 VITALS — BP 164/70 | HR 66

## 2024-01-22 DIAGNOSIS — C61 Malignant neoplasm of prostate: Secondary | ICD-10-CM

## 2024-01-22 DIAGNOSIS — R972 Elevated prostate specific antigen [PSA]: Secondary | ICD-10-CM

## 2024-01-22 DIAGNOSIS — N5201 Erectile dysfunction due to arterial insufficiency: Secondary | ICD-10-CM

## 2024-01-22 LAB — URINALYSIS, ROUTINE W REFLEX MICROSCOPIC
Bilirubin, UA: NEGATIVE
Glucose, UA: NEGATIVE
Ketones, UA: NEGATIVE
Leukocytes,UA: NEGATIVE
Nitrite, UA: NEGATIVE
RBC, UA: NEGATIVE
Specific Gravity, UA: 1.025 (ref 1.005–1.030)
Urobilinogen, Ur: 0.2 mg/dL (ref 0.2–1.0)
pH, UA: 6.5 (ref 5.0–7.5)

## 2024-01-22 MED ORDER — TADALAFIL 20 MG PO TABS: 20.0000 mg | ORAL_TABLET | Freq: Every day | ORAL | 11 refills | Status: AC | PRN

## 2024-01-22 NOTE — Progress Notes (Signed)
 01/22/2024 1:48 PM   Nicholas Levine 11/19/1945 [id number]  Referring provider: Arloa Elsie SAUNDERS, MD [name] [place],  [place] [address]  Followup prostate cancer   HPI: Nicholas Levine is a 77yo here for followup for prostate cancer and erectile dysfunction. PSA increased to 1.3 from 0.6. He denies nay worsening LUTS. No hematuria or dysuria. UA is normal. He uses tadalafil  20mg  prn with good results.    PMH: Past Medical History:  Diagnosis Date   Hypothyroidism    Prostate cancer [facility])     Surgical History: Past Surgical History:  Procedure Laterality Date   PROSTATE BIOPSY     radioactive iodine  1975   to slow thryoid   RADIOACTIVE SEED IMPLANT N/A 02/08/2019   Procedure: RADIOACTIVE SEED IMPLANT/BRACHYTHERAPY IMPLANT;  Surgeon: Sherrilee Belvie CROME, MD;  Location: [facility];  Service: Urology;  Laterality: N/A;   right knee arthroscopy  1971   exploratory surgery for ligement repair   SPACE OAR INSTILLATION N/A 02/08/2019   Procedure: SPACE OAR INSTILLATION;  Surgeon: Sherrilee Belvie CROME, MD;  Location[facility];  Service: Urology;  Laterality: N/A;    Home Medications:  Allergies as of 01/22/2024       Reactions   Sulfa Antibiotics         Medication List        Accurate as of January 22, 2024  1:48 PM. If you have any questions, ask your nurse or doctor.          levothyroxine 50 MCG tablet Commonly known as: SYNTHROID Take 150 mcg by mouth daily.   MULTIVITAMIN ADULT PO Take by mouth.   NON FORMULARY OTC prostate supplement   tadalafil  20 MG tablet Commonly known as: CIALIS  Take 1 tablet (20 mg total) by mouth daily as needed.        Allergies:  Allergies  Allergen Reactions   Sulfa Antibiotics     Family History: Family History  Problem Relation Age of Onset   Leukemia Maternal Aunt    Leukemia Paternal Aunt     Social History:  reports that he has quit smoking. His smoking use  included cigarettes. He has a 3.3 pack-year smoking history. He has never used smokeless tobacco. He reports that he does not currently use alcohol. He reports that he does not use drugs.  ROS: All other review of systems were reviewed and are negative except what is noted above in HPI  Physical Exam: BP (!) 164/70   Pulse 66   Constitutional:  Alert and oriented, No acute distress. HEENT: [place] AT, moist mucus membranes.  Trachea midline, no masses. Cardiovascular: No clubbing, cyanosis, or edema. Respiratory: Normal respiratory effort, no increased work of breathing. GI: Abdomen is soft, nontender, nondistended, no abdominal masses GU: No CVA tenderness.  Lymph: No cervical or inguinal lymphadenopathy. Skin: No rashes, bruises or suspicious lesions. Neurologic: Grossly intact, no focal deficits, moving all 4 extremities. Psychiatric: Normal mood and affect.  Laboratory Data: Lab Results  Component Value Date   WBC 2.8 (L) 02/05/2019   HGB 15.0 02/05/2019   HCT 43.5 02/05/2019   MCV 96.0 02/05/2019   PLT 170 02/05/2019    Lab Results  Component Value Date   CREATININE 0.93 02/05/2019    No results found for: PSA  No results found for: TESTOSTERONE  No results found for: HGBA1C  Urinalysis    Component Value Date/Time   APPEARANCEUR Clear 07/10/2023 1410   GLUCOSEU  Negative 07/10/2023 1410   BILIRUBINUR Negative 07/10/2023 1410   PROTEINUR Trace 07/10/2023 1410   NITRITE Negative 07/10/2023 1410   LEUKOCYTESUR Negative 07/10/2023 1410    Lab Results  Component Value Date   LABMICR Comment 07/10/2023   WBCUA 0-5 07/13/2022   LABEPIT 0-10 07/13/2022   MUCUS Present (A) 07/13/2022   BACTERIA None seen 07/13/2022    Pertinent Imaging:  No results found for this or any previous visit.  No results found for this or any previous visit.  No results found for this or any previous visit.  No results found for this or any previous visit.  No results found  for this or any previous visit.  No results found for this or any previous visit.  No results found for this or any previous visit.  No results found for this or any previous visit.   Assessment & Plan:    1.  Prostate cancer (HCC) (Primary) -PSMA PET, will call with results  2.  Erectile dysfunction due to arterial insufficiency Continue tadalafil  20mg  prn   No follow-ups on file.  Belvie Clara, MD  [facility] [place]

## 2024-01-22 NOTE — Patient Instructions (Signed)

## 2024-02-01 ENCOUNTER — Ambulatory Visit (HOSPITAL_COMMUNITY)
Admission: RE | Admit: 2024-02-01 | Discharge: 2024-02-01 | Disposition: A | Discharge status: Home / Self Care | Source: Ambulatory Visit | Attending: Urology | Admitting: Urology

## 2024-02-01 DIAGNOSIS — C61 Malignant neoplasm of prostate: Secondary | ICD-10-CM | POA: Diagnosis present

## 2024-02-01 MED ORDER — FLOTUFOLASTAT F 18 GALLIUM 296-5846 MBQ/ML IV SOLN
7.7200 | Freq: Once | INTRAVENOUS | Status: AC
  Administered 2024-02-01: 7.72 via INTRAVENOUS
  Filled 2024-02-01: qty 8

## 2024-03-06 ENCOUNTER — Encounter: Payer: Self-pay | Admitting: Urology

## 2024-03-06 ENCOUNTER — Ambulatory Visit (INDEPENDENT_AMBULATORY_CARE_PROVIDER_SITE_OTHER): Admitting: Urology

## 2024-03-06 VITALS — BP 164/74 | HR 67

## 2024-03-06 DIAGNOSIS — C61 Malignant neoplasm of prostate: Secondary | ICD-10-CM | POA: Diagnosis not present

## 2024-03-06 LAB — URINALYSIS, ROUTINE W REFLEX MICROSCOPIC
Bilirubin, UA: NEGATIVE
Glucose, UA: NEGATIVE
Ketones, UA: NEGATIVE
Leukocytes,UA: NEGATIVE
Nitrite, UA: NEGATIVE
RBC, UA: NEGATIVE
Specific Gravity, UA: 1.03 (ref 1.005–1.030)
Urobilinogen, Ur: 1 mg/dL (ref 0.2–1.0)
pH, UA: 6 (ref 5.0–7.5)

## 2024-03-06 NOTE — Progress Notes (Unsigned)
 03/06/2024 2:41 PM   Nicholas Levine 1946-02-17 [id number]  Referring provider: Arloa Elsie SAUNDERS, MD [name] [place],  [place] [address]  Followup prostate cancer   HPI: Mr Keimig is 78yo here for followup for prostate cancer. 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 He denies nay bone pain. He denies any worsening LUTS. No other complaints today   PMH: Past Medical History:  Diagnosis Date   Hypothyroidism    Prostate cancer [facility])     Surgical History: Past Surgical History:  Procedure Laterality Date   PROSTATE BIOPSY     radioactive iodine  1975   to slow thryoid   RADIOACTIVE SEED IMPLANT N/A 02/08/2019   Procedure: RADIOACTIVE SEED IMPLANT/BRACHYTHERAPY IMPLANT;  Surgeon: Sherrilee Belvie CROME, MD;  Location: [facility];  Service: Urology;  Laterality: N/A;   right knee arthroscopy  1971   exploratory surgery for ligement repair   SPACE OAR INSTILLATION N/A 02/08/2019   Procedure: SPACE OAR INSTILLATION;  Surgeon: Sherrilee Belvie CROME, MD;  Location: [facility];  Service: Urology;  Laterality: N/A;    Home Medications:  Allergies as of 03/06/2024       Reactions   Sulfa Antibiotics         Medication List        Accurate as of March 06, 2024  2:41 PM. If you have any questions, ask your nurse or doctor.          levothyroxine 50 MCG tablet Commonly known as: SYNTHROID Take 150 mcg by mouth daily.   MULTIVITAMIN ADULT PO Take by mouth.   NON FORMULARY OTC prostate supplement   tadalafil  20 MG tablet Commonly known as: CIALIS  Take 1 tablet (20 mg total) by mouth daily as needed.        Allergies:  Allergies  Allergen Reactions   Sulfa Antibiotics     Family History: Family History  Problem Relation Age of Onset   Leukemia Maternal Aunt    Leukemia Paternal Aunt     Social History:  reports that he has quit smoking. His smoking use  included cigarettes. He has a 3.3 pack-year smoking history. He has never used smokeless tobacco. He reports that he does not currently use alcohol. He reports that he does not use drugs.  ROS: All other review of systems were reviewed and are negative except what is noted above in HPI  Physical Exam: BP (!) 164/74   Pulse 67   Constitutional:  Alert and oriented, No acute distress. HEENT: [place] AT, moist mucus membranes.  Trachea midline, no masses. Cardiovascular: No clubbing, cyanosis, or edema. Respiratory: Normal respiratory effort, no increased work of breathing. GI: Abdomen is soft, nontender, nondistended, no abdominal masses GU: No CVA tenderness.  Lymph: No cervical or inguinal lymphadenopathy. Skin: No rashes, bruises or suspicious lesions. Neurologic: Grossly intact, no focal deficits, moving all 4 extremities. Psychiatric: Normal mood and affect.  Laboratory Data: Lab Results  Component Value Date   WBC 2.8 (L) 02/05/2019   HGB 15.0 02/05/2019   HCT 43.5 02/05/2019   MCV 96.0 02/05/2019   PLT 170 02/05/2019    Lab Results  Component Value Date   CREATININE 0.93 02/05/2019    No results found for: PSA  No results found for: TESTOSTERONE  No results found for: HGBA1C  Urinalysis    Component Value Date/Time   APPEARANCEUR Clear 01/22/2024 1329   GLUCOSEU Negative 01/22/2024 1329  BILIRUBINUR Negative 01/22/2024 1329   PROTEINUR Trace 01/22/2024 1329   NITRITE Negative 01/22/2024 1329   LEUKOCYTESUR Negative 01/22/2024 1329    Lab Results  Component Value Date   LABMICR Comment 01/22/2024   WBCUA 0-5 07/13/2022   LABEPIT 0-10 07/13/2022   MUCUS Present (A) 07/13/2022   BACTERIA None seen 07/13/2022    Pertinent Imaging: PSMA PET: Images reviewed and discussed with the patient No results found for this or any previous visit.  No results found for this or any previous visit.  No results found for this or any previous visit.  No results  found for this or any previous visit.  No results found for this or any previous visit.  No results found for this or any previous visit.  No results found for this or any previous visit.  No results found for this or any previous visit.   Assessment & Plan:    1. Prostate cancer (HCC) (Primary) We discussed observation versus ADT and we have elected to proceed with observation. Followup 3 months with a PSA - Urinalysis, Routine w reflex microscopic   No follow-ups on file.  Belvie Clara, MD  [facility] Urology [place]

## 2024-03-06 NOTE — Patient Instructions (Signed)

## 2024-06-26 ENCOUNTER — Other Ambulatory Visit

## 2024-06-28 ENCOUNTER — Other Ambulatory Visit

## 2024-06-28 DIAGNOSIS — C61 Malignant neoplasm of prostate: Secondary | ICD-10-CM

## 2024-06-29 LAB — PSA: Prostate Specific Ag, Serum: 3.6 ng/mL (ref 0.0–4.0)

## 2024-07-10 ENCOUNTER — Ambulatory Visit: Admitting: Urology
